# Patient Record
Sex: Male | Born: 1990
Health system: Southern US, Community
[De-identification: ages and names within clinical notes are randomized; demographics above are authoritative.]

## PROBLEM LIST (undated history)

## (undated) DIAGNOSIS — F32A Depression, unspecified: Secondary | ICD-10-CM

## (undated) DIAGNOSIS — R112 Nausea with vomiting, unspecified: Secondary | ICD-10-CM

## (undated) DIAGNOSIS — N289 Disorder of kidney and ureter, unspecified: Secondary | ICD-10-CM

## (undated) DIAGNOSIS — N201 Calculus of ureter: Secondary | ICD-10-CM

## (undated) DIAGNOSIS — Z9889 Other specified postprocedural states: Secondary | ICD-10-CM

## (undated) DIAGNOSIS — F419 Anxiety disorder, unspecified: Secondary | ICD-10-CM

## (undated) DIAGNOSIS — R51 Headache: Secondary | ICD-10-CM

## (undated) DIAGNOSIS — Z87442 Personal history of urinary calculi: Secondary | ICD-10-CM

## (undated) DIAGNOSIS — F329 Major depressive disorder, single episode, unspecified: Secondary | ICD-10-CM

## (undated) DIAGNOSIS — A4902 Methicillin resistant Staphylococcus aureus infection, unspecified site: Secondary | ICD-10-CM

## (undated) DIAGNOSIS — F909 Attention-deficit hyperactivity disorder, unspecified type: Secondary | ICD-10-CM

## (undated) DIAGNOSIS — R519 Headache, unspecified: Secondary | ICD-10-CM

## (undated) DIAGNOSIS — J45909 Unspecified asthma, uncomplicated: Secondary | ICD-10-CM

## (undated) HISTORY — DX: Anxiety disorder, unspecified: F41.9

## (undated) HISTORY — PX: MOUTH SURGERY: SHX715

## (undated) HISTORY — DX: Major depressive disorder, single episode, unspecified: F32.9

## (undated) HISTORY — DX: Unspecified asthma, uncomplicated: J45.909

## (undated) HISTORY — DX: Depression, unspecified: F32.A

---
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
## 2012-09-23 ENCOUNTER — Ambulatory Visit: Payer: No Typology Code available for payment source

## 2012-09-23 ENCOUNTER — Encounter: Payer: Self-pay | Admitting: Family Medicine

## 2012-09-23 ENCOUNTER — Ambulatory Visit (INDEPENDENT_AMBULATORY_CARE_PROVIDER_SITE_OTHER): Payer: No Typology Code available for payment source | Admitting: Family Medicine

## 2012-09-23 VITALS — BP 140/70 | HR 84 | Temp 98.0°F | Resp 16 | Ht 70.5 in | Wt 212.8 lb

## 2012-09-23 DIAGNOSIS — S61219A Laceration without foreign body of unspecified finger without damage to nail, initial encounter: Secondary | ICD-10-CM

## 2012-09-23 DIAGNOSIS — M25549 Pain in joints of unspecified hand: Secondary | ICD-10-CM

## 2012-09-23 DIAGNOSIS — S61209A Unspecified open wound of unspecified finger without damage to nail, initial encounter: Secondary | ICD-10-CM

## 2012-09-23 MED ORDER — HYDROCODONE-ACETAMINOPHEN 5-325 MG PO TABS: 1.0000 | ORAL_TABLET | Freq: Four times a day (QID) | ORAL | Status: DC | PRN

## 2012-09-23 NOTE — Progress Notes (Signed)
Xray read and patient discussed with Dr. Voss. Agree with assessment and plan of care per her note.   

## 2012-09-23 NOTE — Patient Instructions (Addendum)
You have a laceration of your finger that goes into the joint and you disrupted the ligament on the outside of the joint capsule. Your xrays do not show any fracture.  Call Dr. Mina Marble with hand surgery tomorrow. He is expecting your phone call and will see you in the office tomorrow.   [facility] [address] [phone]

## 2012-09-23 NOTE — Progress Notes (Signed)
  Subjective:    Patient ID: Chris Proctor [facility], male    DOB: 1991/01/27, 22 y.o.   MRN: [id number]  HPI Patient presents with left index finger laceration that happened tonight. He was opening a package with a pocket knife and accidentally jabbed himself with the pocket knife. He had immediate pain and bleeding. Denies numbness of the finger. Can flex and extend the finger. Has normal sensation distally   Review of Systems  All other systems reviewed and are negative.       Objective:   Physical Exam  Constitutional: He is oriented to person, place, and time. He appears well-developed and well-nourished. He appears distressed.  HENT:  Head: Normocephalic and atraumatic.  Eyes: Conjunctivae and EOM are normal.  Cardiovascular: Normal rate.   Pulmonary/Chest: Effort normal.  Neurological: He is alert and oriented to person, place, and time.  Skin: He is diaphoretic.  Psychiatric: He has a normal mood and affect.  Left Hand: There is a 1 cm laceration on the radial aspect of the index finger between the DIP and PIP joint. Patient is able to actively flex and extend digit. There is apparent disruption of the radial collateral ligament with ulnar subluxation of the distal phalynx with digital flexion. On exploration, there is visible complete disruption of the radial collateral ligament with disruption of the joint capsule and visible joint space. Minimal bleeding. Normal sensation and capillary refill  [facility] reading (PRIMARY) by  Dr. Neomia Dear. 3 view xrays of the left index finger obtained and personally reviewed show no bony deformities or fracture.    Assessment & Plan:  #1. Left index finger laceration with open disruption PIP joint and complete separation of radial collateral ligament. - Digital block with aggressive irrigation and exploration shows open disruption of joint capsule and disruption of radial collateral ligament of the PIP joint. - Xrays of finger show - Discussed with ortho  hand, Dr. Mina Marble. Recommended loose closure and f/u tomorrow. - Last tetanus 2 yrs ago. Not due.  Procedure Note: Patient verbally consented. Digital block to left index finger performed using 4 cc of 1% lidocaine. Anesthesia verified. Skin and wound cleaned with soapy water. Wound then explored with forceps and hemostat and disruption of joint capsule and radial collateral ligament identified. Wound aggressively irrigated and xrays obtained. Case discussed with Dr. Markus Jarvis recommended loose closure and followup tomorrow. Skin loosely closed with 5-0 prolene with 3 sutures. Patient tolerated procedure well without complication.

## 2013-09-20 ENCOUNTER — Ambulatory Visit (INDEPENDENT_AMBULATORY_CARE_PROVIDER_SITE_OTHER): Payer: No Typology Code available for payment source | Admitting: Emergency Medicine

## 2013-09-20 VITALS — BP 130/72 | HR 97 | Temp 97.9°F | Resp 16 | Ht 71.0 in | Wt 207.0 lb

## 2013-09-20 DIAGNOSIS — L0390 Cellulitis, unspecified: Principal | ICD-10-CM

## 2013-09-20 DIAGNOSIS — L0291 Cutaneous abscess, unspecified: Secondary | ICD-10-CM

## 2013-09-20 MED ORDER — SULFAMETHOXAZOLE-TMP DS 800-160 MG PO TABS: 1.0000 | ORAL_TABLET | Freq: Two times a day (BID) | ORAL | Status: DC

## 2013-09-20 NOTE — Progress Notes (Signed)
[facility] [address], [place] [address] [phone]  Date:  09/20/2013   Name:  Chris Proctor   DOB:  07/03/1990   MRN:  [id number]  PCP:  Londell MohPHARR,WALTER DAVIDSON, MD    Chief Complaint: Neck Pain   History of Present Illness:  Chris Proctor is a 23 y.o. very pleasant male patient who presents with the following:  "pimple" on the back of the neck since yesterday.  No fever or chills. Had some drainage.  No fever or chills.  History of MRSA.  No improvement with over the counter medications or other home remedies. Denies other complaint or health concern today.   There are no active problems to display for this patient.   Past Medical History  Diagnosis Date  . Anxiety   . Asthma   . Depression     History reviewed. No pertinent past surgical history.  History  Substance Use Topics  . Smoking status: Never Smoker   . Smokeless tobacco: Not on file  . Alcohol Use: 1.2 oz/week    2 Cans of beer per week    Family History  Problem Relation Age of Onset  . Hyperlipidemia Mother     No Known Allergies  Medication list has been reviewed and updated.  Current Outpatient Prescriptions on File Prior to Visit  Medication Sig Dispense Refill  . methylphenidate (CONCERTA) 54 MG CR tablet Take 54 mg by mouth daily.      . Sertraline HCl (ZOLOFT PO) Take by mouth.       No current facility-administered medications on file prior to visit.    Review of Systems:  As per HPI, otherwise negative.  Marland Kitchen.  Physical Examination: Filed Vitals:   09/20/13 0923  BP: 130/72  Pulse: 97  Temp: 97.9 F (36.6 C)  Resp: 16   Filed Vitals:   09/20/13 0923  Height: 5\' 11"  (1.803 m)  Weight: 207 lb (93.895 kg)   Body mass index is 28.88 kg/(m^2). Ideal Body Weight: Weight in (lb) to have BMI = 25: 178.9   GEN: WDWN, NAD, Non-toxic, Alert & Oriented x 3 HEENT: Atraumatic, Normocephalic.  Ears and Nose: No external deformity. EXTR: No  clubbing/cyanosis/edema NEURO: Normal gait.  PSYCH: Normally interactive. Conversant. Not depressed or anxious appearing.  Calm demeanor.  NECK:  Marble sized mass on back of neck.  Tender.  Not fluctuant.  No cellulitis.   Assessment and Plan: Abscess neck Septra  Local heat Follow up PRN   Signed,  Phillips OdorJeffery Ryver Zadrozny, MD

## 2013-09-20 NOTE — Patient Instructions (Signed)

## 2014-07-12 IMAGING — CR DG FINGER INDEX 2+V*L*
1 series · 1 of 1 positions shown · non-contrast
Comparison: None

CLINICAL DATA: No laceration.

LEFT INDEX FINGER 2+V

[PA]
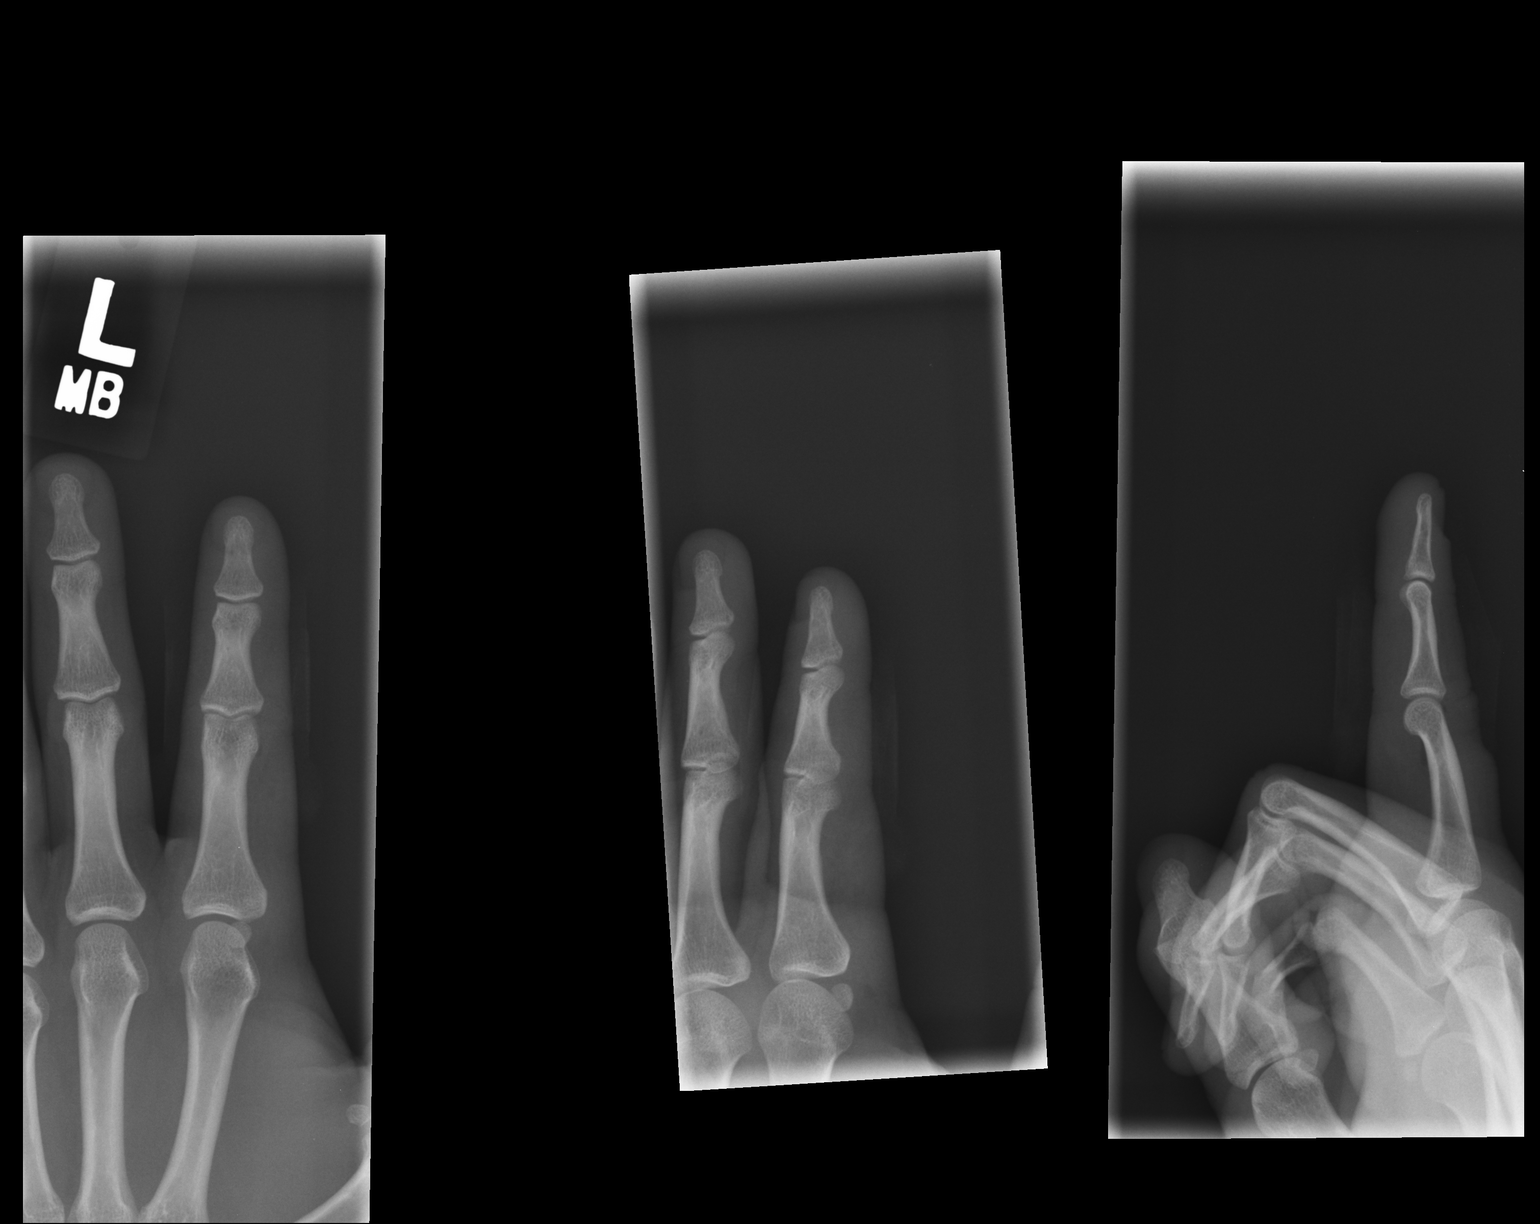

[1 of 1 positions shown; findings below may reference images not displayed]

FINDINGS: No acute bony abnormality.  Specifically, no fracture,
subluxation, or dislocation.  Soft tissues are intact. Joint spaces
are maintained.  Normal bone mineralization.  No radiopaque foreign
body.
IMPRESSION: No acute bony abnormality.

Clinically significant discrepancy from primary report, if
provided: None

## 2014-11-30 NOTE — Progress Notes (Signed)
Please put orders in Epic surgery 12-05-14 pre op 12-01-14 Thanks 

## 2014-11-30 NOTE — Patient Instructions (Addendum)
[facility]  11/30/2014   Your procedure is scheduled on: 12/05/2014   Report to [facility] Main  Entrance take [facility]  elevators to 3rd floor to  Short Stay Center at   0530 AM.  Call this number if you have problems the morning of surgery [phone]   Remember: ONLY 1 PERSON MAY GO WITH YOU TO SHORT STAY TO GET  READY MORNING OF YOUR SURGERY.  Do not eat food or drink liquids :After Midnight.     Take these medicines the morning of surgery with A SIP OF WATER:  Albuterol Inhaler if needed and bring,  Zoloft                                 You may not have any metal on your body including hair pins and              piercings  Do not wear jewelry, , lotions, powders or perfumes, deodorant                         Men may shave face and neck.   Do not bring valuables to the hospital. [facility] IS NOT             RESPONSIBLE   FOR VALUABLES.  Contacts, dentures or bridgework may not be worn into surgery.       Patients discharged the day of surgery will not be allowed to drive home.  Name and phone number of your driver:  Special Instructions: coughing and deep breathing exercises, leg exercises               Please read over the following fact sheets you were given: _____________________________________________________________________             [facility] - Preparing for Surgery Before surgery, you can play an important role.  Because skin is not sterile, your skin needs to be as free of germs as possible.  You can reduce the number of germs on your skin by washing with CHG (chlorahexidine gluconate) soap before surgery.  CHG is an antiseptic cleaner which kills germs and bonds with the skin to continue killing germs even after washing. Please DO NOT use if you have an allergy to CHG or antibacterial soaps.  If your skin becomes reddened/irritated stop using the CHG and inform your nurse when you arrive at Short Stay. Do not shave (including legs  and underarms) for at least 48 hours prior to the first CHG shower.  You may shave your face/neck. Please follow these instructions carefully:  1.  Shower with CHG Soap the night before surgery and the  morning of Surgery.  2.  If you choose to wash your hair, wash your hair first as usual with your  normal  shampoo.  3.  After you shampoo, rinse your hair and body thoroughly to remove the  shampoo.                           4.  Use CHG as you would any other liquid soap.  You can apply chg directly  to the skin and wash  Gently with a scrungie or clean washcloth.  5.  Apply the CHG Soap to your body ONLY FROM THE NECK DOWN.   Do not use on face/ open                           Wound or open sores. Avoid contact with eyes, ears mouth and genitals (private parts).                       Wash face,  Genitals (private parts) with your normal soap.             6.  Wash thoroughly, paying special attention to the area where your surgery  will be performed.  7.  Thoroughly rinse your body with warm water from the neck down.  8.  DO NOT shower/wash with your normal soap after using and rinsing off  the CHG Soap.                9.  Pat yourself dry with a clean towel.            10.  Wear clean pajamas.            11.  Place clean sheets on your bed the night of your first shower and do not  sleep with pets. Day of Surgery : Do not apply any lotions/deodorants the morning of surgery.  Please wear clean clothes to the hospital/surgery center.  FAILURE TO FOLLOW THESE INSTRUCTIONS MAY RESULT IN THE CANCELLATION OF YOUR SURGERY PATIENT SIGNATURE_________________________________  NURSE SIGNATURE__________________________________  ________________________________________________________________________

## 2014-12-01 ENCOUNTER — Ambulatory Visit: Payer: Self-pay | Admitting: Surgery

## 2014-12-01 ENCOUNTER — Encounter (HOSPITAL_COMMUNITY): Payer: Self-pay

## 2014-12-01 ENCOUNTER — Encounter (HOSPITAL_COMMUNITY)
Admission: RE | Admit: 2014-12-01 | Discharge: 2014-12-01 | Disposition: A | Discharge status: Home / Self Care | Payer: No Typology Code available for payment source | Source: Ambulatory Visit | Attending: Surgery | Admitting: Surgery

## 2014-12-01 DIAGNOSIS — K4090 Unilateral inguinal hernia, without obstruction or gangrene, not specified as recurrent: Secondary | ICD-10-CM | POA: Insufficient documentation

## 2014-12-01 DIAGNOSIS — Z01818 Encounter for other preprocedural examination: Secondary | ICD-10-CM | POA: Insufficient documentation

## 2014-12-01 HISTORY — DX: Headache, unspecified: R51.9

## 2014-12-01 HISTORY — DX: Other specified postprocedural states: Z98.890

## 2014-12-01 HISTORY — DX: Attention-deficit hyperactivity disorder, unspecified type: F90.9

## 2014-12-01 HISTORY — DX: Headache: R51

## 2014-12-01 HISTORY — DX: Nausea with vomiting, unspecified: R11.2

## 2014-12-01 HISTORY — DX: Methicillin resistant Staphylococcus aureus infection, unspecified site: A49.02

## 2014-12-01 LAB — CBC
HCT: 46.3 % (ref 39.0–52.0)
Hemoglobin: 16 g/dL (ref 13.0–17.0)
MCH: 28.2 pg (ref 26.0–34.0)
MCHC: 34.6 g/dL (ref 30.0–36.0)
MCV: 81.5 fL (ref 78.0–100.0)
Platelets: 239 10*3/uL (ref 150–400)
RBC: 5.68 MIL/uL (ref 4.22–5.81)
RDW: 13.9 % (ref 11.5–15.5)
WBC: 7.5 10*3/uL (ref 4.0–10.5)

## 2014-12-01 LAB — SURGICAL PCR SCREEN
MRSA, PCR: NEGATIVE
Staphylococcus aureus: NEGATIVE

## 2014-12-01 NOTE — Progress Notes (Signed)
91.5 on temp for preop appointment was posted in error.  Correct temp for preop appt was 97.5

## 2014-12-04 ENCOUNTER — Encounter (HOSPITAL_COMMUNITY): Payer: Self-pay | Admitting: Anesthesiology

## 2014-12-04 DIAGNOSIS — K4090 Unilateral inguinal hernia, without obstruction or gangrene, not specified as recurrent: Secondary | ICD-10-CM

## 2014-12-04 NOTE — Anesthesia Preprocedure Evaluation (Addendum)
Anesthesia Evaluation  Patient identified by MRN, date of birth, ID band Patient awake    Reviewed: Allergy & Precautions, NPO status , Patient's Chart, lab work & pertinent test results  History of Anesthesia Complications (+) PONV and history of anesthetic complications  Airway Mallampati: II  TM Distance: >3 FB Neck ROM: Full    Dental no notable dental hx.    Pulmonary asthma ,    Pulmonary exam normal breath sounds clear to auscultation       Cardiovascular negative cardio ROS Normal cardiovascular exam Rhythm:Regular Rate:Normal     Neuro/Psych  Headaches, PSYCHIATRIC DISORDERS Anxiety Depression    GI/Hepatic negative GI ROS, Neg liver ROS,   Endo/Other  negative endocrine ROS  Renal/GU negative Renal ROS  negative genitourinary   Musculoskeletal negative musculoskeletal ROS (+)   Abdominal (+) + obese,   Peds negative pediatric ROS (+)  Hematology negative hematology ROS (+)   Anesthesia Other Findings   Reproductive/Obstetrics negative OB ROS                             Anesthesia Physical Anesthesia Plan  ASA: II  Anesthesia Plan: General   Post-op Pain Management:    Induction: Intravenous  Airway Management Planned: Oral ETT  Additional Equipment:   Intra-op Plan:   Post-operative Plan: Extubation in OR  Informed Consent: I have reviewed the patients History and Physical, chart, labs and discussed the procedure including the risks, benefits and alternatives for the proposed anesthesia with the patient or authorized representative who has indicated his/her understanding and acceptance.   Dental advisory given  Plan Discussed with: CRNA  Anesthesia Plan Comments:        Anesthesia Quick Evaluation

## 2014-12-04 NOTE — H&P (Signed)
General Surgery [facility]  [place] DOB: 03/28/1990 Single / Language: [place] / Race: White Male  History of Present Illness Patient words: hernia.  The patient is a 24 year old male who presents with an inguinal hernia. Patient is referred by the urgent care in [facility] for evaluation of newly diagnosed left inguinal hernia. Patient had experienced intermittent discomfort and had noticed a swelling in the left groin region over the past few months. He presented to urgent care for evaluation. He was noted to have a left inguinal hernia and was referred to general surgery for repair. Since the patient's family lives here, he returned home for evaluation. Patient has noted intermittent discomfort. He denies any signs or symptoms of obstruction. He has had no prior abdominal surgery and no prior hernia repairs.   Other Problems Anxiety Disorder Asthma Depression Migraine Headache Other disease, cancer, significant illness  Past Surgical History Oral Surgery  Diagnostic Studies History Colonoscopy never  Allergies No Known Drug Allergies10/06/2014  Medication History ProAir HFA (108 (90 Base)MCG/ACT Aerosol Soln, Inhalation) Active. Methylphenidate HCl ER (  Tablet ER, Oral) Active. Sertraline HCl (  Tablet, Oral) Active. Medications Reconciled  Social History Alcohol use Occasional alcohol use. Caffeine use Carbonated beverages, Coffee, Tea. No drug use Tobacco use Never smoker.  Family History Arthritis Mother. Heart Disease Father, Mother. Hypertension Father, Mother.  Review of Systems General Not Present- Appetite Loss, Chills, Fatigue, Fever, Night Sweats, Weight Gain and Weight Loss. Skin Not Present- Change in Wart/Mole, Dryness, Hives, Jaundice, New Lesions, Non-Healing Wounds, Rash and Ulcer. HEENT Not Present- Earache, Hearing Loss, Hoarseness, Nose Bleed, Oral Ulcers, Ringing in the  Ears, Seasonal Allergies, Sinus Pain, Sore Throat, Visual Disturbances, Wears glasses/contact lenses and Yellow Eyes. Respiratory Not Present- Bloody sputum, Chronic Cough, Difficulty Breathing, Snoring and Wheezing. Breast Not Present- Breast Mass, Breast Pain, Nipple Discharge and Skin Changes. Cardiovascular Not Present- Chest Pain, Difficulty Breathing Lying Down, Leg Cramps, Palpitations, Rapid Heart Rate, Shortness of Breath and Swelling of Extremities. Gastrointestinal Not Present- Abdominal Pain, Bloating, Bloody Stool, Change in Bowel Habits, Chronic diarrhea, Constipation, Difficulty Swallowing, Excessive gas, Gets full quickly at meals, Hemorrhoids, Indigestion, Nausea, Rectal Pain and Vomiting. Male Genitourinary Not Present- Blood in Urine, Change in Urinary Stream, Frequency, Impotence, Nocturia, Painful Urination, Urgency and Urine Leakage. Musculoskeletal Not Present- Back Pain, Joint Pain, Joint Stiffness, Muscle Pain, Muscle Weakness and Swelling of Extremities. Neurological Not Present- Decreased Memory, Fainting, Headaches, Numbness, Seizures, Tingling, Tremor, Trouble walking and Weakness. Psychiatric Present- Anxiety and Depression. Not Present- Bipolar, Change in Sleep Pattern, Fearful and Frequent crying. Endocrine Not Present- Cold Intolerance, Excessive Hunger, Hair Changes, Heat Intolerance, Hot flashes and New Diabetes. Hematology Not Present- Easy Bruising, Excessive bleeding, Gland problems, HIV and Persistent Infections.   Vitals Weight: 246.4 lb Height: 72in Body Surface Area: 2.38 m Body Mass Index: 33.42 kg/m Temp.: 98.38F(Oral)  Pulse: 91 (Regular)  BP: 118/74 (Sitting, Left Arm, Standard)    Physical Exam  General - appears comfortable, no distress; not diaphorectic  HEENT - normocephalic; sclerae clear, gaze conjugate; mucous membranes moist, dentition good; voice normal  Neck - symmetric on extension; no palpable anterior or posterior  cervical adenopathy; no palpable masses in the thyroid bed  Chest - clear bilaterally without rhonchi, rales, or wheeze  Cor - regular rhythm with normal rate; no significant murmur  Abd - soft without distension; no sign of umbilical hernia  GU - normal male genitalia without mass or lesion; palpation in  the right inguinal canal with cough and Valsalva shows no sign of hernia; palpation in the left inguinal canal shows a moderate to large inguinal hernia sac which is mildly tender but soft and relatively reducible; with cough and Valsalva it augments  Ext - non-tender without significant edema or lymphedema  Neuro - grossly intact; no tremor   Assessment & Plan  REDUCIBLE LEFT INGUINAL HERNIA (K40.90)  Patient presents with symptomatic left inguinal hernia. Patient and his mother are provided with written literature on hernia repair to review at home.  I recommended repair of left inguinal hernia by open technique with mesh. We discussed the risk and benefits including the potential for recurrence. We have discussed restrictions on his activities following the procedure.  Patient is on fall break and would like to proceed with hernia repair as soon as possible. We will make arrangements for surgery as an outpatient at a time convenient for the patient.  The risks and benefits of the procedure have been discussed at length with the patient. The patient understands the proposed procedure, potential alternative treatments, and the course of recovery to be expected. All of the patient's questions have been answered at this time. The patient wishes to proceed with surgery.  Velora Heckler, MD, [facility] Office: [phone]

## 2014-12-05 ENCOUNTER — Ambulatory Visit (HOSPITAL_COMMUNITY): Payer: No Typology Code available for payment source | Admitting: Anesthesiology

## 2014-12-05 ENCOUNTER — Encounter (HOSPITAL_COMMUNITY): Payer: Self-pay

## 2014-12-05 ENCOUNTER — Encounter (HOSPITAL_COMMUNITY)
Admission: RE | Disposition: A | Discharge status: Home / Self Care | Payer: Self-pay | Source: Ambulatory Visit | Attending: Surgery

## 2014-12-05 ENCOUNTER — Ambulatory Visit (HOSPITAL_COMMUNITY)
Admission: RE | Admit: 2014-12-05 | Discharge: 2014-12-05 | Disposition: A | Discharge status: Home / Self Care | Payer: No Typology Code available for payment source | Source: Ambulatory Visit | Attending: Surgery | Admitting: Surgery

## 2014-12-05 DIAGNOSIS — K4090 Unilateral inguinal hernia, without obstruction or gangrene, not specified as recurrent: Secondary | ICD-10-CM | POA: Insufficient documentation

## 2014-12-05 DIAGNOSIS — J45909 Unspecified asthma, uncomplicated: Secondary | ICD-10-CM | POA: Insufficient documentation

## 2014-12-05 DIAGNOSIS — Z6833 Body mass index (BMI) 33.0-33.9, adult: Secondary | ICD-10-CM | POA: Diagnosis not present

## 2014-12-05 DIAGNOSIS — F329 Major depressive disorder, single episode, unspecified: Secondary | ICD-10-CM | POA: Diagnosis not present

## 2014-12-05 DIAGNOSIS — E669 Obesity, unspecified: Secondary | ICD-10-CM | POA: Diagnosis not present

## 2014-12-05 DIAGNOSIS — F419 Anxiety disorder, unspecified: Secondary | ICD-10-CM | POA: Diagnosis not present

## 2014-12-05 HISTORY — PX: INSERTION OF MESH: SHX5868

## 2014-12-05 HISTORY — PX: INGUINAL HERNIA REPAIR: SHX194

## 2014-12-05 SURGERY — REPAIR, HERNIA, INGUINAL, ADULT
Anesthesia: General | Laterality: Left

## 2014-12-05 MED ORDER — CEFAZOLIN SODIUM-DEXTROSE 2-3 GM-% IV SOLR
2.0000 g | INTRAVENOUS | Status: AC
  Administered 2014-12-05: 2 g via INTRAVENOUS

## 2014-12-05 MED ORDER — ROCURONIUM BROMIDE 100 MG/10ML IV SOLN
INTRAVENOUS | Status: AC
  Filled 2014-12-05: qty 1

## 2014-12-05 MED ORDER — FENTANYL CITRATE (PF) 250 MCG/5ML IJ SOLN
INTRAMUSCULAR | Status: AC
  Filled 2014-12-05: qty 25

## 2014-12-05 MED ORDER — CEFAZOLIN SODIUM-DEXTROSE 2-3 GM-% IV SOLR
INTRAVENOUS | Status: AC
Start: 2014-12-05 — End: 2014-12-05
  Filled 2014-12-05: qty 50

## 2014-12-05 MED ORDER — ONDANSETRON HCL 4 MG/2ML IJ SOLN
INTRAMUSCULAR | Status: DC | PRN
  Administered 2014-12-05: 4 mg via INTRAVENOUS

## 2014-12-05 MED ORDER — PROPOFOL 10 MG/ML IV BOLUS
INTRAVENOUS | Status: DC | PRN
  Administered 2014-12-05: 200 mg via INTRAVENOUS

## 2014-12-05 MED ORDER — SCOPOLAMINE 1 MG/3DAYS TD PT72
MEDICATED_PATCH | TRANSDERMAL | Status: DC | PRN
  Administered 2014-12-05: 1 via TRANSDERMAL

## 2014-12-05 MED ORDER — LIDOCAINE HCL (CARDIAC) 20 MG/ML IV SOLN
INTRAVENOUS | Status: DC | PRN
  Administered 2014-12-05: 100 mg via INTRAVENOUS

## 2014-12-05 MED ORDER — SCOPOLAMINE 1 MG/3DAYS TD PT72
MEDICATED_PATCH | TRANSDERMAL | Status: AC
  Filled 2014-12-05: qty 1

## 2014-12-05 MED ORDER — HYDROMORPHONE HCL 2 MG/ML IJ SOLN
INTRAMUSCULAR | Status: AC
  Filled 2014-12-05: qty 1

## 2014-12-05 MED ORDER — ONDANSETRON HCL 4 MG/2ML IJ SOLN
INTRAMUSCULAR | Status: AC
  Filled 2014-12-05: qty 2

## 2014-12-05 MED ORDER — FENTANYL CITRATE (PF) 100 MCG/2ML IJ SOLN
INTRAMUSCULAR | Status: DC | PRN
  Administered 2014-12-05: 50 ug via INTRAVENOUS
  Administered 2014-12-05: 100 ug via INTRAVENOUS
  Administered 2014-12-05 (×2): 50 ug via INTRAVENOUS

## 2014-12-05 MED ORDER — GLYCOPYRROLATE 0.2 MG/ML IJ SOLN
INTRAMUSCULAR | Status: AC
  Filled 2014-12-05: qty 3

## 2014-12-05 MED ORDER — BUPIVACAINE HCL (PF) 0.5 % IJ SOLN
INTRAMUSCULAR | Status: AC
  Filled 2014-12-05: qty 30

## 2014-12-05 MED ORDER — BUPIVACAINE HCL (PF) 0.5 % IJ SOLN
INTRAMUSCULAR | Status: DC | PRN
Start: 2014-12-05 — End: 2014-12-05
  Administered 2014-12-05: 30 mL

## 2014-12-05 MED ORDER — FENTANYL CITRATE (PF) 100 MCG/2ML IJ SOLN: 25.0000 ug | INTRAMUSCULAR | Status: DC | PRN

## 2014-12-05 MED ORDER — HYDROMORPHONE HCL 1 MG/ML IJ SOLN
INTRAMUSCULAR | Status: DC | PRN
  Administered 2014-12-05: 0.5 mg via INTRAVENOUS
  Administered 2014-12-05: 1 mg via INTRAVENOUS
  Administered 2014-12-05: 0.5 mg via INTRAVENOUS

## 2014-12-05 MED ORDER — LACTATED RINGERS IV SOLN: INTRAVENOUS | Status: DC

## 2014-12-05 MED ORDER — MIDAZOLAM HCL 5 MG/5ML IJ SOLN
INTRAMUSCULAR | Status: DC | PRN
  Administered 2014-12-05: 2 mg via INTRAVENOUS

## 2014-12-05 MED ORDER — NEOSTIGMINE METHYLSULFATE 10 MG/10ML IV SOLN
INTRAVENOUS | Status: DC | PRN
  Administered 2014-12-05: 4 mg via INTRAVENOUS

## 2014-12-05 MED ORDER — PROMETHAZINE HCL 25 MG/ML IJ SOLN: 6.2500 mg | INTRAMUSCULAR | Status: DC | PRN

## 2014-12-05 MED ORDER — DEXAMETHASONE SODIUM PHOSPHATE 10 MG/ML IJ SOLN
INTRAMUSCULAR | Status: AC
  Filled 2014-12-05: qty 1

## 2014-12-05 MED ORDER — BUPIVACAINE-EPINEPHRINE (PF) 0.25% -1:200000 IJ SOLN
INTRAMUSCULAR | Status: AC
  Filled 2014-12-05: qty 30

## 2014-12-05 MED ORDER — NEOSTIGMINE METHYLSULFATE 10 MG/10ML IV SOLN
INTRAVENOUS | Status: AC
  Filled 2014-12-05: qty 1

## 2014-12-05 MED ORDER — DEXAMETHASONE SODIUM PHOSPHATE 10 MG/ML IJ SOLN
INTRAMUSCULAR | Status: DC | PRN
  Administered 2014-12-05: 10 mg via INTRAVENOUS

## 2014-12-05 MED ORDER — LACTATED RINGERS IV SOLN
INTRAVENOUS | Status: DC | PRN
  Administered 2014-12-05 (×2): via INTRAVENOUS

## 2014-12-05 MED ORDER — MIDAZOLAM HCL 2 MG/2ML IJ SOLN
INTRAMUSCULAR | Status: AC
  Filled 2014-12-05: qty 4

## 2014-12-05 MED ORDER — PROPOFOL 10 MG/ML IV BOLUS
INTRAVENOUS | Status: AC
  Filled 2014-12-05: qty 20

## 2014-12-05 MED ORDER — LIDOCAINE HCL (CARDIAC) 20 MG/ML IV SOLN
INTRAVENOUS | Status: AC
  Filled 2014-12-05: qty 5

## 2014-12-05 MED ORDER — GLYCOPYRROLATE 0.2 MG/ML IJ SOLN
INTRAMUSCULAR | Status: DC | PRN
  Administered 2014-12-05: 0.6 mg via INTRAVENOUS

## 2014-12-05 MED ORDER — ROCURONIUM BROMIDE 100 MG/10ML IV SOLN
INTRAVENOUS | Status: DC | PRN
  Administered 2014-12-05: 5 mg via INTRAVENOUS
  Administered 2014-12-05: 25 mg via INTRAVENOUS
  Administered 2014-12-05 (×2): 10 mg via INTRAVENOUS

## 2014-12-05 MED ORDER — HYDROCODONE-ACETAMINOPHEN 5-325 MG PO TABS: 1.0000 | ORAL_TABLET | ORAL | Status: DC | PRN

## 2014-12-05 MED ORDER — SUCCINYLCHOLINE CHLORIDE 20 MG/ML IJ SOLN
INTRAMUSCULAR | Status: DC | PRN
  Administered 2014-12-05: 100 mg via INTRAVENOUS

## 2014-12-05 SURGICAL SUPPLY — 37 items
APL SKNCLS STERI-STRIP NONHPOA (GAUZE/BANDAGES/DRESSINGS) ×1
APPLICATOR COTTON TIP 6IN STRL (MISCELLANEOUS) ×2 IMPLANT
BENZOIN TINCTURE PRP APPL 2/3 (GAUZE/BANDAGES/DRESSINGS) ×2 IMPLANT
BLADE HEX COATED 2.75 (ELECTRODE) ×2 IMPLANT
BLADE SURG 15 STRL LF DISP TIS (BLADE) ×1 IMPLANT
BLADE SURG 15 STRL SS (BLADE) ×2
BLADE SURG SZ10 CARB STEEL (BLADE) IMPLANT
COVER SURGICAL LIGHT HANDLE (MISCELLANEOUS) ×2 IMPLANT
DECANTER SPIKE VIAL GLASS SM (MISCELLANEOUS) ×1 IMPLANT
DRAIN PENROSE 18X1/2 LTX STRL (DRAIN) ×2 IMPLANT
DRAPE LAPAROTOMY TRNSV 102X78 (DRAPE) ×2 IMPLANT
ELECT PENCIL ROCKER SW 15FT (MISCELLANEOUS) ×2 IMPLANT
ELECT REM PT RETURN 9FT ADLT (ELECTROSURGICAL) ×2
ELECTRODE REM PT RTRN 9FT ADLT (ELECTROSURGICAL) ×1 IMPLANT
GAUZE SPONGE 4X4 12PLY STRL (GAUZE/BANDAGES/DRESSINGS) ×2 IMPLANT
GLOVE BIOGEL PI IND STRL 7.0 (GLOVE) ×1 IMPLANT
GLOVE BIOGEL PI INDICATOR 7.0 (GLOVE) ×1
GLOVE SURG ORTHO 8.0 STRL STRW (GLOVE) ×2 IMPLANT
GOWN STRL REUS W/TWL LRG LVL3 (GOWN DISPOSABLE) ×2 IMPLANT
GOWN STRL REUS W/TWL XL LVL3 (GOWN DISPOSABLE) ×4 IMPLANT
KIT BASIN OR (CUSTOM PROCEDURE TRAY) ×2 IMPLANT
MESH ULTRAPRO 3X6 7.6X15CM (Mesh General) ×1 IMPLANT
NDL HYPO 25X1 1.5 SAFETY (NEEDLE) ×1 IMPLANT
NEEDLE HYPO 25X1 1.5 SAFETY (NEEDLE) ×2 IMPLANT
NS IRRIG 1000ML POUR BTL (IV SOLUTION) ×3 IMPLANT
PACK BASIC VI WITH GOWN DISP (CUSTOM PROCEDURE TRAY) ×2 IMPLANT
SPONGE LAP 4X18 X RAY DECT (DISPOSABLE) ×4 IMPLANT
STRIP CLOSURE SKIN 1/2X4 (GAUZE/BANDAGES/DRESSINGS) ×2 IMPLANT
SUT MNCRL AB 4-0 PS2 18 (SUTURE) ×2 IMPLANT
SUT NOVA NAB GS-22 2 0 T19 (SUTURE) ×5 IMPLANT
SUT SILK 2 0 SH (SUTURE) ×2 IMPLANT
SUT VIC AB 3-0 SH 18 (SUTURE) ×3 IMPLANT
SYR BULB IRRIGATION 50ML (SYRINGE) ×2 IMPLANT
SYR CONTROL 10ML LL (SYRINGE) ×2 IMPLANT
TAPE CLOTH SURG 4X10 WHT LF (GAUZE/BANDAGES/DRESSINGS) ×1 IMPLANT
TOWEL OR 17X26 10 PK STRL BLUE (TOWEL DISPOSABLE) ×2 IMPLANT
YANKAUER SUCT BULB TIP 10FT TU (MISCELLANEOUS) ×2 IMPLANT

## 2014-12-05 NOTE — Discharge Instructions (Signed)

## 2014-12-05 NOTE — Op Note (Signed)
Inguinal Hernia, Open, Procedure Note  Pre-operative Diagnosis:  Left inguinal hernia, reducible  Post-operative Diagnosis: same  Surgeon:  Velora Heckler, MD, FACS  Anesthesia:  General  Preparation:  Chlora-prep  Estimated Blood Loss: Minimal  Complications:  none  Indications: The patient presented with a left, reducible hernia.  The patient is a 24 year old male who presents with an inguinal hernia. Patient is referred by the urgent care in [facility] for evaluation of newly diagnosed left inguinal hernia. Patient had experienced intermittent discomfort and had noticed a swelling in the left groin region over the past few months. He presented to urgent care for evaluation. He was noted to have a left inguinal hernia and was referred to general surgery for repair.   Procedure Details  The patient was evaluated in the holding area. All of the patient's questions were answered and the proposed procedure was confirmed. The site of the procedure was properly marked. The patient was taken to the Operating Room, identified by name, and the procedure verified as inguinal hernia repair.  The patient was placed in the supine position and underwent induction of anesthesia. A "Time Out" was performed per routine. The lower abdomen and groin were prepped and draped in the usual aseptic fashion.  After ascertaining that an adequate level of anesthesia had been obtained, an incision was made in the groin with a #10 blade.  Dissection was carried through the subcutaneous tissues and hemostasis obtained with the electrocautery.  A Gelpi retractor was placed for exposure.  The external oblique fascia was incised in line with it's fibers and extended through the external inguinal ring.  The cord structures were dissected out of the inguinal canal and encircled with a Penrose drain.  The floor of the inguinal canal was dissected out.  There was some laxity but no fascial defect.  The cord was  explored and a large indirect hernia sac was found and dissected out.  A high ligation was performed with a 2-0 silk suture.  The sac was excised and discarded.  The floor of the inguinal canal was reconstructed with Ethicon Ultrapro mesh cut to the appropriate dimensions.  It was secured to the pubic tubercle with a 2-0 Novafil suture and along the inguinal ligament with a running 2-0 Novafil suture.  Mesh was split to accommodate the cord structures.  The superior margin of the mesh was secured to the transversalis and internal oblique musculature with interrupted 2-0 Novafil sutures.  The tails of the mesh were overlapped lateral to the cord structures and secured to the inguinal ligament with interrupted 2-0 Novafil sutures to recreate the internal inguinal ring.  Cord structures were returned to the inguinal canal.  Local anesthetic was infiltrated throughout the field.  External oblique fascia was closed with interrupted 3-0 Vicryl sutures.  Subcutaneous tissues were closed with interrupted 3-0 Vicryl sutures.  Skin was anesthetized with local anesthetic, and the skin edges were re-approximated with a running 4-0 Monocryl suture.  Wound was washed and dried and benzoin and steristrips were applied.  A gauze dressing was applied.  Instrument, sponge, and needle counts were correct prior to closure and at the conclusion of the case.  The patient tolerated the procedure well.  The patient was awakened from anesthesia and brought to the recovery room in stable condition.  Velora Heckler, MD, [facility] Office: [phone]

## 2014-12-05 NOTE — Anesthesia Postprocedure Evaluation (Signed)
  Anesthesia Post-op Note  Patient: Chris Proctor  Procedure(s) Performed: Procedure(s): LEFT INGUINAL HERNIA REPAIR WITH  MESH (Left) INSERTION OF MESH (Left)  Patient Location: PACU  Anesthesia Type:General  Level of Consciousness: awake and alert   Airway and Oxygen Therapy: Patient Spontanous Breathing  Post-op Pain: Controlled  Post-op Assessment: Post-op Vital signs reviewed, Patient's Cardiovascular Status Stable and Respiratory Function Stable  Post-op Vital Signs: Reviewed  Filed Vitals:   12/05/14 1000  BP: 131/65  Pulse:   Temp: 36.7 C  Resp: 15    Complications: No apparent anesthesia complications

## 2014-12-05 NOTE — Transfer of Care (Signed)
Immediate Anesthesia Transfer of Care Note  Patient: Chris Proctor  Procedure(s) Performed: Procedure(s): LEFT INGUINAL HERNIA REPAIR WITH  MESH (Left) INSERTION OF MESH (Left)  Patient Location: PACU  Anesthesia Type:General  Level of Consciousness: awake, alert  and oriented  Airway & Oxygen Therapy: Patient Spontanous Breathing and Patient connected to face mask oxygen  Post-op Assessment: Report given to RN and Post -op Vital signs reviewed and stable  Post vital signs: Reviewed and stable  Last Vitals:  Filed Vitals:   12/05/14 0524  BP: 131/73  Pulse: 77  Temp: 36.5 C  Resp: 18    Complications: No apparent anesthesia complications

## 2014-12-05 NOTE — Anesthesia Procedure Notes (Signed)
Procedure Name: Intubation Date/Time: 12/05/2014 7:27 AM Performed by: Enriqueta Shutter D Pre-anesthesia Checklist: Patient identified, Emergency Drugs available, Suction available and Patient being monitored Patient Re-evaluated:Patient Re-evaluated prior to inductionOxygen Delivery Method: Circle System Utilized Preoxygenation: Pre-oxygenation with 100% oxygen Intubation Type: IV induction Ventilation: Mask ventilation without difficulty Laryngoscope Size: Mac and 4 Grade View: Grade II Tube type: Oral Tube size: 7.5 mm Number of attempts: 1 Airway Equipment and Method: Stylet and Oral airway Placement Confirmation: ETT inserted through vocal cords under direct vision,  positive ETCO2 and breath sounds checked- equal and bilateral Secured at: 22 cm Tube secured with: Tape Dental Injury: Teeth and Oropharynx as per pre-operative assessment

## 2014-12-05 NOTE — Interval H&P Note (Signed)
History and Physical Interval Note:  12/05/2014 7:08 AM  Chris Proctor  has presented today for surgery, with the diagnosis of Left Inguinal Hernia.  The various methods of treatment have been discussed with the patient and family. After consideration of risks, benefits and other options for treatment, the patient has consented to    Procedure(s): LEFT INGUINAL HERNIA REPAIR WITH  MESH (Left) INSERTION OF MESH (Left) as a surgical intervention .    The patient's history has been reviewed, patient examined, no change in status, stable for surgery.  I have reviewed the patient's chart and labs.  Questions were answered to the patient's satisfaction.    Velora Heckler, MD, [facility] Office: [phone]    Destinie Thornsberry Judie Petit

## 2016-02-08 DIAGNOSIS — H6123 Impacted cerumen, bilateral: Secondary | ICD-10-CM | POA: Diagnosis not present

## 2016-06-26 ENCOUNTER — Encounter (HOSPITAL_COMMUNITY): Payer: Self-pay

## 2016-06-26 ENCOUNTER — Emergency Department (HOSPITAL_COMMUNITY)
Admission: EM | Admit: 2016-06-26 | Discharge: 2016-06-26 | Disposition: A | Discharge status: Home / Self Care | Payer: BLUE CROSS/BLUE SHIELD | Attending: Emergency Medicine | Admitting: Emergency Medicine

## 2016-06-26 ENCOUNTER — Emergency Department (HOSPITAL_COMMUNITY): Payer: BLUE CROSS/BLUE SHIELD

## 2016-06-26 DIAGNOSIS — N200 Calculus of kidney: Secondary | ICD-10-CM | POA: Insufficient documentation

## 2016-06-26 DIAGNOSIS — J45909 Unspecified asthma, uncomplicated: Secondary | ICD-10-CM | POA: Diagnosis not present

## 2016-06-26 DIAGNOSIS — N1330 Unspecified hydronephrosis: Secondary | ICD-10-CM | POA: Insufficient documentation

## 2016-06-26 DIAGNOSIS — F909 Attention-deficit hyperactivity disorder, unspecified type: Secondary | ICD-10-CM | POA: Insufficient documentation

## 2016-06-26 DIAGNOSIS — R109 Unspecified abdominal pain: Secondary | ICD-10-CM | POA: Diagnosis present

## 2016-06-26 HISTORY — DX: Disorder of kidney and ureter, unspecified: N28.9

## 2016-06-26 LAB — URINALYSIS, ROUTINE W REFLEX MICROSCOPIC
Bacteria, UA: NONE SEEN
Bilirubin Urine: NEGATIVE
Glucose, UA: NEGATIVE mg/dL
Ketones, ur: NEGATIVE mg/dL
Leukocytes, UA: NEGATIVE
Nitrite: NEGATIVE
Protein, ur: 100 mg/dL — AB
Specific Gravity, Urine: 1.023 (ref 1.005–1.030)
Squamous Epithelial / LPF: NONE SEEN
pH: 5 (ref 5.0–8.0)

## 2016-06-26 LAB — I-STAT CHEM 8, ED
BUN: 12 mg/dL (ref 6–20)
Calcium, Ion: 1.18 mmol/L (ref 1.15–1.40)
Chloride: 105 mmol/L (ref 101–111)
Creatinine, Ser: 0.7 mg/dL (ref 0.61–1.24)
Glucose, Bld: 103 mg/dL — ABNORMAL HIGH (ref 65–99)
HCT: 43 % (ref 39.0–52.0)
Hemoglobin: 14.6 g/dL (ref 13.0–17.0)
Potassium: 4.5 mmol/L (ref 3.5–5.1)
Sodium: 140 mmol/L (ref 135–145)
TCO2: 27 mmol/L (ref 0–100)

## 2016-06-26 MED ORDER — TAMSULOSIN HCL 0.4 MG PO CAPS: 0.4000 mg | ORAL_CAPSULE | Freq: Every day | ORAL | 0 refills | Status: DC

## 2016-06-26 MED ORDER — KETOROLAC TROMETHAMINE 30 MG/ML IJ SOLN
30.0000 mg | Freq: Once | INTRAMUSCULAR | Status: AC
  Administered 2016-06-26: 30 mg via INTRAVENOUS
  Filled 2016-06-26: qty 1

## 2016-06-26 MED ORDER — HYDROMORPHONE HCL 1 MG/ML IJ SOLN
1.0000 mg | Freq: Once | INTRAMUSCULAR | Status: AC
  Administered 2016-06-26: 1 mg via INTRAVENOUS
  Filled 2016-06-26: qty 1

## 2016-06-26 MED ORDER — OXYCODONE-ACETAMINOPHEN 5-325 MG PO TABS: 1.0000 | ORAL_TABLET | Freq: Four times a day (QID) | ORAL | 0 refills | Status: DC | PRN

## 2016-06-26 MED ORDER — ONDANSETRON 4 MG PO TBDP: 4.0000 mg | ORAL_TABLET | Freq: Three times a day (TID) | ORAL | 0 refills | Status: DC | PRN

## 2016-06-26 MED ORDER — ONDANSETRON HCL 4 MG/2ML IJ SOLN
4.0000 mg | Freq: Once | INTRAMUSCULAR | Status: AC
  Administered 2016-06-26: 4 mg via INTRAVENOUS
  Filled 2016-06-26: qty 2

## 2016-06-26 MED ORDER — OXYCODONE-ACETAMINOPHEN 5-325 MG PO TABS
1.0000 | ORAL_TABLET | Freq: Once | ORAL | Status: AC
  Administered 2016-06-26: 1 via ORAL
  Filled 2016-06-26: qty 1

## 2016-06-26 MED ORDER — IBUPROFEN 600 MG PO TABS: 600.0000 mg | ORAL_TABLET | Freq: Four times a day (QID) | ORAL | 0 refills | Status: DC | PRN

## 2016-06-26 NOTE — ED Provider Notes (Signed)
[facility]-EMERGENCY DEPT Provider Note   CSN: [id number] Arrival date & time: 06/26/16  0335     History   Chief Complaint Chief Complaint  Patient presents with  . Flank Pain    HPI Chris Proctor is a 26 y.o. male.  26 year old male with a history of anxiety, depression, and asthma presents to the emergency department for evaluation of left-sided flank pain which woke him from sleep at 3 AM. He states that pain is nonradiating and constant. It is currently rated at 7/10. No medications taken prior to arrival for symptoms. Patient reports that symptoms feel similar to past history of kidney stones. Last stone was 2 years ago. No history of intervention to promote stone movement. Patient reports noticing a darker color to his urine yesterday. No associated vomiting, fevers, dysuria. Abdominal surgical history significant for inguinal hernia repair.   The history is provided by the patient. No language interpreter was used.  Flank Pain     Past Medical History:  Diagnosis Date  . ADHD (attention deficit hyperactivity disorder)   . Anxiety   . Asthma   . Depression   . Headache    hx of migraines   . MRSA infection    hx of in 2006 and 2009   . PONV (postoperative nausea and vomiting)   . Renal disorder     Patient Active Problem List   Diagnosis Date Noted  . Reducible left inguinal hernia 12/04/2014    Past Surgical History:  Procedure Laterality Date  . INGUINAL HERNIA REPAIR Left 12/05/2014   Procedure: LEFT INGUINAL HERNIA REPAIR WITH  MESH;  Surgeon: Darnell Level, MD;  Location: [facility] ORS;  Service: General;  Laterality: Left;  . INSERTION OF MESH Left 12/05/2014   Procedure: INSERTION OF MESH;  Surgeon: Darnell Level, MD;  Location: [facility] ORS;  Service: General;  Laterality: Left;  . MOUTH SURGERY         Home Medications    Prior to Admission medications   Medication Sig Start Date End Date Taking? Authorizing Provider  albuterol (PROVENTIL HFA;VENTOLIN HFA) 108  (90 BASE) MCG/ACT inhaler Inhale 1 puff into the lungs every 6 (six) hours as needed for wheezing or shortness of breath.   Yes Historical Provider, MD  buPROPion (WELLBUTRIN XL) 150 MG 24 hr tablet Take 1 tablet by mouth daily. 06/05/16  Yes Historical Provider, MD  methylphenidate (CONCERTA) 54 MG CR tablet Take 54 mg by mouth every morning.    Yes Historical Provider, MD  sertraline (ZOLOFT) 100 MG tablet TAKE 1 1/2 TABLETS BY MOUTH ONCE A DAY 11/02/14  Yes Historical Provider, MD  HYDROcodone-acetaminophen (NORCO/VICODIN) 5-325 MG tablet Take 1-2 tablets by mouth every 4 (four) hours as needed for moderate pain. Patient not taking: Reported on 06/26/2016 12/05/14   Darnell Level, MD  ibuprofen (ADVIL,MOTRIN) 600 MG tablet Take 1 tablet (600 mg total) by mouth every 6 (six) hours as needed. 06/26/16   Antony Madura, PA-C  ondansetron (ZOFRAN ODT) 4 MG disintegrating tablet Take 1 tablet (4 mg total) by mouth every 8 (eight) hours as needed for nausea or vomiting. 06/26/16   Antony Madura, PA-C  oxyCODONE-acetaminophen (PERCOCET/ROXICET) 5-325 MG tablet Take 1-2 tablets by mouth every 6 (six) hours as needed for severe pain. 06/26/16   Antony Madura, PA-C  tamsulosin (FLOMAX) 0.4 MG CAPS capsule Take 1 capsule (0.4 mg total) by mouth daily. 06/26/16   Antony Madura, PA-C    Family History Family History  Problem Relation Age of Onset  .  Hyperlipidemia Mother     Social History Social History  Substance Use Topics  . Smoking status: Never Smoker  . Smokeless tobacco: Never Used  . Alcohol use 1.2 oz/week    2 Cans of beer per week     Comment: 1-2 x per week or beer on weekend     Allergies   Patient has no known allergies.   Review of Systems Review of Systems  Genitourinary: Positive for flank pain.   Ten systems reviewed and are negative for acute change, except as noted in the HPI.    Physical Exam Updated Vital Signs BP 135/86 (BP Location: Right Arm)   Pulse 78   Temp 98 F (36.7 C)  (Oral)   Resp 18   SpO2 99%   Physical Exam  Constitutional: He is oriented to person, place, and time. He appears well-developed and well-nourished. No distress.  Patient calm, cooperative  HENT:  Head: Normocephalic and atraumatic.  Eyes: Conjunctivae and EOM are normal. No scleral icterus.  Neck: Normal range of motion.  Cardiovascular: Normal rate, regular rhythm and intact distal pulses.   Pulmonary/Chest: Effort normal. No respiratory distress.  Respirations even and unlabored  Abdominal: Soft. He exhibits no distension and no mass. There is no guarding.  Soft abdomen. No anterior tenderness to palpation.  Musculoskeletal: Normal range of motion.  Neurological: He is alert and oriented to person, place, and time. He exhibits normal muscle tone. Coordination normal.  Skin: Skin is warm and dry. No rash noted. He is not diaphoretic. No erythema. No pallor.  Psychiatric: He has a normal mood and affect. His behavior is normal.  Nursing note and vitals reviewed.    ED Treatments / Results  Labs (all labs ordered are listed, but only abnormal results are displayed) Labs Reviewed  URINALYSIS, ROUTINE W REFLEX MICROSCOPIC - Abnormal; Notable for the following:       Result Value   APPearance CLOUDY (*)    Hgb urine dipstick LARGE (*)    Protein, ur 100 (*)    All other components within normal limits  I-STAT CHEM 8, ED - Abnormal; Notable for the following:    Glucose, Bld 103 (*)    All other components within normal limits    EKG  EKG Interpretation None       Radiology US Renal  Result Date: 06/26/2016 CLINICAL DATA:  Acute left flank pain and hematuria. EXAM: RENAL / URINARY TRACT ULTRASOUND COMPLETE COMPARISON:  None. FINDINGS: Right Kidney: Length: 10.9 cm. Echogenicity within normal limits. No mass or hydronephrosis visualized. Left Kidney: Length: 12.8 cm.  Mild hydronephrosis.  No calculi are visible. Bladder: Appears normal for degree of bladder distention.  IMPRESSION: Mild left hydronephrosis. Electronically Signed   By: Ellery Plunk M.D.   On: 06/26/2016 06:42    Procedures Procedures (including critical care time)  Medications Ordered in ED Medications  ketorolac (TORADOL) 30 MG/ML injection 30 mg (30 mg Intravenous Given 06/26/16 0546)  ondansetron (ZOFRAN) injection 4 mg (4 mg Intravenous Given 06/26/16 0546)  HYDROmorphone (DILAUDID) injection 1 mg (1 mg Intravenous Given 06/26/16 0546)     Initial Impression / Assessment and Plan / ED Course  I have reviewed the triage vital signs and the nursing notes.  Pertinent labs & imaging results that were available during my care of the patient were reviewed by me and considered in my medical decision making (see chart for details).     Pt has been diagnosed with a kidney stone.  There is no evidence of significant hydronephrosis, serum creatine WNL, vitals sign stable and the pt does not have irratractable vomiting. Pt will be discharged home with pain medications and has been advised to follow up with PCP. Return precautions provided at discharge. Patient agreeable to plan with no unaddressed concerns.   Final Clinical Impressions(s) / ED Diagnoses   Final diagnoses:  Kidney stone  Left flank pain    New Prescriptions New Prescriptions   IBUPROFEN (ADVIL,MOTRIN) 600 MG TABLET    Take 1 tablet (600 mg total) by mouth every 6 (six) hours as needed.   ONDANSETRON (ZOFRAN ODT) 4 MG DISINTEGRATING TABLET    Take 1 tablet (4 mg total) by mouth every 8 (eight) hours as needed for nausea or vomiting.   OXYCODONE-ACETAMINOPHEN (PERCOCET/ROXICET) 5-325 MG TABLET    Take 1-2 tablets by mouth every 6 (six) hours as needed for severe pain.   TAMSULOSIN (FLOMAX) 0.4 MG CAPS CAPSULE    Take 1 capsule (0.4 mg total) by mouth daily.     Antony Madura, PA-C 06/26/16 1610    Paula Libra, MD 06/26/16 [phone]

## 2017-01-15 DIAGNOSIS — D485 Neoplasm of uncertain behavior of skin: Secondary | ICD-10-CM | POA: Diagnosis not present

## 2017-01-24 DIAGNOSIS — D485 Neoplasm of uncertain behavior of skin: Secondary | ICD-10-CM | POA: Diagnosis not present

## 2017-01-24 DIAGNOSIS — D2239 Melanocytic nevi of other parts of face: Secondary | ICD-10-CM | POA: Diagnosis not present

## 2017-01-24 DIAGNOSIS — L720 Epidermal cyst: Secondary | ICD-10-CM | POA: Diagnosis not present

## 2017-04-23 DIAGNOSIS — F900 Attention-deficit hyperactivity disorder, predominantly inattentive type: Secondary | ICD-10-CM | POA: Diagnosis not present

## 2017-04-23 DIAGNOSIS — J111 Influenza due to unidentified influenza virus with other respiratory manifestations: Secondary | ICD-10-CM | POA: Diagnosis not present

## 2017-04-23 DIAGNOSIS — J0100 Acute maxillary sinusitis, unspecified: Secondary | ICD-10-CM | POA: Diagnosis not present

## 2017-07-26 DIAGNOSIS — J0100 Acute maxillary sinusitis, unspecified: Secondary | ICD-10-CM | POA: Diagnosis not present

## 2017-07-26 DIAGNOSIS — R110 Nausea: Secondary | ICD-10-CM | POA: Diagnosis not present

## 2017-07-26 DIAGNOSIS — R42 Dizziness and giddiness: Secondary | ICD-10-CM | POA: Diagnosis not present

## 2017-09-08 DIAGNOSIS — F900 Attention-deficit hyperactivity disorder, predominantly inattentive type: Secondary | ICD-10-CM | POA: Diagnosis not present

## 2017-09-08 DIAGNOSIS — M545 Low back pain: Secondary | ICD-10-CM | POA: Diagnosis not present

## 2017-09-09 DIAGNOSIS — M9903 Segmental and somatic dysfunction of lumbar region: Secondary | ICD-10-CM | POA: Diagnosis not present

## 2017-09-09 DIAGNOSIS — M9901 Segmental and somatic dysfunction of cervical region: Secondary | ICD-10-CM | POA: Diagnosis not present

## 2017-09-09 DIAGNOSIS — M47816 Spondylosis without myelopathy or radiculopathy, lumbar region: Secondary | ICD-10-CM | POA: Diagnosis not present

## 2017-09-09 DIAGNOSIS — M9902 Segmental and somatic dysfunction of thoracic region: Secondary | ICD-10-CM | POA: Diagnosis not present

## 2017-09-10 DIAGNOSIS — M47816 Spondylosis without myelopathy or radiculopathy, lumbar region: Secondary | ICD-10-CM | POA: Diagnosis not present

## 2017-09-10 DIAGNOSIS — M9901 Segmental and somatic dysfunction of cervical region: Secondary | ICD-10-CM | POA: Diagnosis not present

## 2017-09-10 DIAGNOSIS — M9903 Segmental and somatic dysfunction of lumbar region: Secondary | ICD-10-CM | POA: Diagnosis not present

## 2017-09-10 DIAGNOSIS — M9902 Segmental and somatic dysfunction of thoracic region: Secondary | ICD-10-CM | POA: Diagnosis not present

## 2017-09-15 DIAGNOSIS — M9901 Segmental and somatic dysfunction of cervical region: Secondary | ICD-10-CM | POA: Diagnosis not present

## 2017-09-15 DIAGNOSIS — M9902 Segmental and somatic dysfunction of thoracic region: Secondary | ICD-10-CM | POA: Diagnosis not present

## 2017-09-15 DIAGNOSIS — M9903 Segmental and somatic dysfunction of lumbar region: Secondary | ICD-10-CM | POA: Diagnosis not present

## 2017-09-15 DIAGNOSIS — M47816 Spondylosis without myelopathy or radiculopathy, lumbar region: Secondary | ICD-10-CM | POA: Diagnosis not present

## 2017-09-16 DIAGNOSIS — M47816 Spondylosis without myelopathy or radiculopathy, lumbar region: Secondary | ICD-10-CM | POA: Diagnosis not present

## 2017-09-16 DIAGNOSIS — M9903 Segmental and somatic dysfunction of lumbar region: Secondary | ICD-10-CM | POA: Diagnosis not present

## 2017-09-16 DIAGNOSIS — M9901 Segmental and somatic dysfunction of cervical region: Secondary | ICD-10-CM | POA: Diagnosis not present

## 2017-09-16 DIAGNOSIS — M9902 Segmental and somatic dysfunction of thoracic region: Secondary | ICD-10-CM | POA: Diagnosis not present

## 2017-09-17 DIAGNOSIS — M47816 Spondylosis without myelopathy or radiculopathy, lumbar region: Secondary | ICD-10-CM | POA: Diagnosis not present

## 2017-09-17 DIAGNOSIS — M9902 Segmental and somatic dysfunction of thoracic region: Secondary | ICD-10-CM | POA: Diagnosis not present

## 2017-09-17 DIAGNOSIS — M9901 Segmental and somatic dysfunction of cervical region: Secondary | ICD-10-CM | POA: Diagnosis not present

## 2017-09-17 DIAGNOSIS — M9903 Segmental and somatic dysfunction of lumbar region: Secondary | ICD-10-CM | POA: Diagnosis not present

## 2017-09-22 DIAGNOSIS — M47816 Spondylosis without myelopathy or radiculopathy, lumbar region: Secondary | ICD-10-CM | POA: Diagnosis not present

## 2017-09-22 DIAGNOSIS — M9901 Segmental and somatic dysfunction of cervical region: Secondary | ICD-10-CM | POA: Diagnosis not present

## 2017-09-22 DIAGNOSIS — M9903 Segmental and somatic dysfunction of lumbar region: Secondary | ICD-10-CM | POA: Diagnosis not present

## 2017-09-22 DIAGNOSIS — M9902 Segmental and somatic dysfunction of thoracic region: Secondary | ICD-10-CM | POA: Diagnosis not present

## 2017-09-23 DIAGNOSIS — M9902 Segmental and somatic dysfunction of thoracic region: Secondary | ICD-10-CM | POA: Diagnosis not present

## 2017-09-23 DIAGNOSIS — M9903 Segmental and somatic dysfunction of lumbar region: Secondary | ICD-10-CM | POA: Diagnosis not present

## 2017-09-23 DIAGNOSIS — M9901 Segmental and somatic dysfunction of cervical region: Secondary | ICD-10-CM | POA: Diagnosis not present

## 2017-09-23 DIAGNOSIS — M47816 Spondylosis without myelopathy or radiculopathy, lumbar region: Secondary | ICD-10-CM | POA: Diagnosis not present

## 2017-09-24 DIAGNOSIS — M9901 Segmental and somatic dysfunction of cervical region: Secondary | ICD-10-CM | POA: Diagnosis not present

## 2017-09-24 DIAGNOSIS — M9903 Segmental and somatic dysfunction of lumbar region: Secondary | ICD-10-CM | POA: Diagnosis not present

## 2017-09-24 DIAGNOSIS — M47816 Spondylosis without myelopathy or radiculopathy, lumbar region: Secondary | ICD-10-CM | POA: Diagnosis not present

## 2017-09-24 DIAGNOSIS — M9902 Segmental and somatic dysfunction of thoracic region: Secondary | ICD-10-CM | POA: Diagnosis not present

## 2017-09-29 DIAGNOSIS — M9901 Segmental and somatic dysfunction of cervical region: Secondary | ICD-10-CM | POA: Diagnosis not present

## 2017-09-29 DIAGNOSIS — M47816 Spondylosis without myelopathy or radiculopathy, lumbar region: Secondary | ICD-10-CM | POA: Diagnosis not present

## 2017-09-29 DIAGNOSIS — M9903 Segmental and somatic dysfunction of lumbar region: Secondary | ICD-10-CM | POA: Diagnosis not present

## 2017-09-29 DIAGNOSIS — M9902 Segmental and somatic dysfunction of thoracic region: Secondary | ICD-10-CM | POA: Diagnosis not present

## 2017-09-30 DIAGNOSIS — M9901 Segmental and somatic dysfunction of cervical region: Secondary | ICD-10-CM | POA: Diagnosis not present

## 2017-09-30 DIAGNOSIS — M9903 Segmental and somatic dysfunction of lumbar region: Secondary | ICD-10-CM | POA: Diagnosis not present

## 2017-09-30 DIAGNOSIS — M47816 Spondylosis without myelopathy or radiculopathy, lumbar region: Secondary | ICD-10-CM | POA: Diagnosis not present

## 2017-09-30 DIAGNOSIS — M9902 Segmental and somatic dysfunction of thoracic region: Secondary | ICD-10-CM | POA: Diagnosis not present

## 2017-10-01 DIAGNOSIS — M9901 Segmental and somatic dysfunction of cervical region: Secondary | ICD-10-CM | POA: Diagnosis not present

## 2017-10-01 DIAGNOSIS — M47816 Spondylosis without myelopathy or radiculopathy, lumbar region: Secondary | ICD-10-CM | POA: Diagnosis not present

## 2017-10-01 DIAGNOSIS — M9902 Segmental and somatic dysfunction of thoracic region: Secondary | ICD-10-CM | POA: Diagnosis not present

## 2017-10-01 DIAGNOSIS — M9903 Segmental and somatic dysfunction of lumbar region: Secondary | ICD-10-CM | POA: Diagnosis not present

## 2017-10-06 DIAGNOSIS — M47816 Spondylosis without myelopathy or radiculopathy, lumbar region: Secondary | ICD-10-CM | POA: Diagnosis not present

## 2017-10-06 DIAGNOSIS — M9902 Segmental and somatic dysfunction of thoracic region: Secondary | ICD-10-CM | POA: Diagnosis not present

## 2017-10-06 DIAGNOSIS — M9903 Segmental and somatic dysfunction of lumbar region: Secondary | ICD-10-CM | POA: Diagnosis not present

## 2017-10-06 DIAGNOSIS — M9901 Segmental and somatic dysfunction of cervical region: Secondary | ICD-10-CM | POA: Diagnosis not present

## 2017-10-07 DIAGNOSIS — M9902 Segmental and somatic dysfunction of thoracic region: Secondary | ICD-10-CM | POA: Diagnosis not present

## 2017-10-07 DIAGNOSIS — M9901 Segmental and somatic dysfunction of cervical region: Secondary | ICD-10-CM | POA: Diagnosis not present

## 2017-10-07 DIAGNOSIS — M9903 Segmental and somatic dysfunction of lumbar region: Secondary | ICD-10-CM | POA: Diagnosis not present

## 2017-10-07 DIAGNOSIS — M47816 Spondylosis without myelopathy or radiculopathy, lumbar region: Secondary | ICD-10-CM | POA: Diagnosis not present

## 2018-02-24 DIAGNOSIS — J0100 Acute maxillary sinusitis, unspecified: Secondary | ICD-10-CM | POA: Diagnosis not present

## 2018-02-24 DIAGNOSIS — J45909 Unspecified asthma, uncomplicated: Secondary | ICD-10-CM | POA: Diagnosis not present

## 2018-03-10 DIAGNOSIS — F900 Attention-deficit hyperactivity disorder, predominantly inattentive type: Secondary | ICD-10-CM | POA: Diagnosis not present

## 2018-04-01 IMAGING — US US RENAL
1 series · 14 of 25 positions shown · non-contrast
Comparison: None.

CLINICAL DATA: Acute left flank pain and hematuria.

EXAM:
RENAL / URINARY TRACT ULTRASOUND COMPLETE

[Series 1: us renal · 0.31mm/px · 14 of 36 slices shown]
[im 1/36]
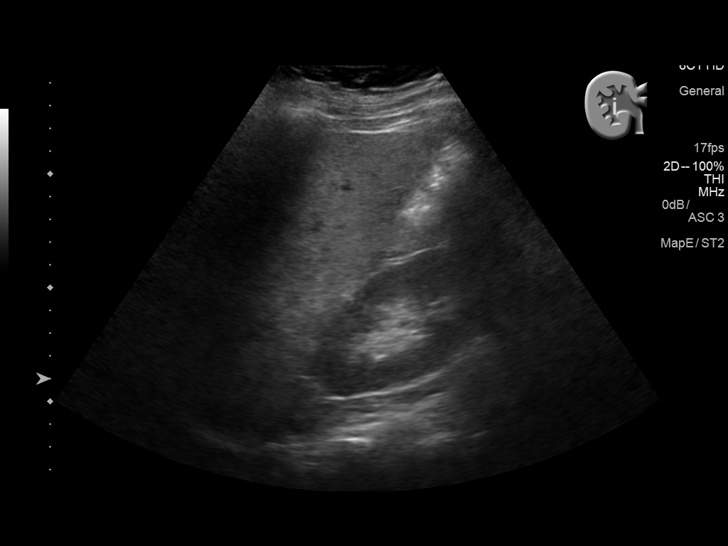
[im 3/36]
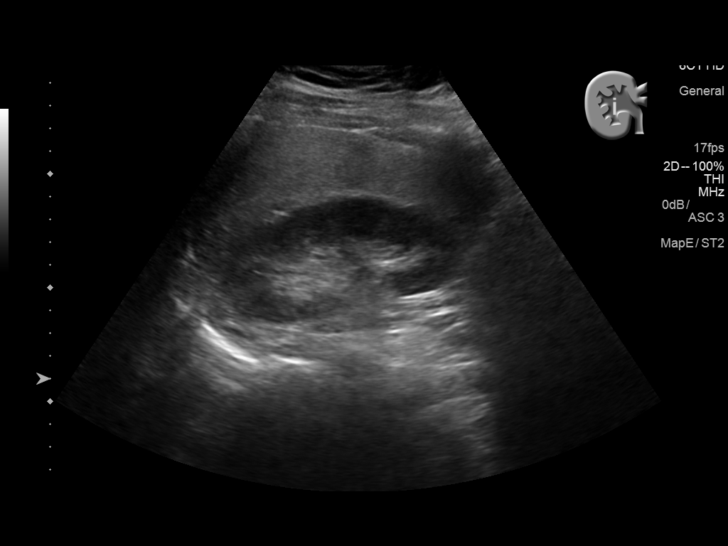
[im 6/36]
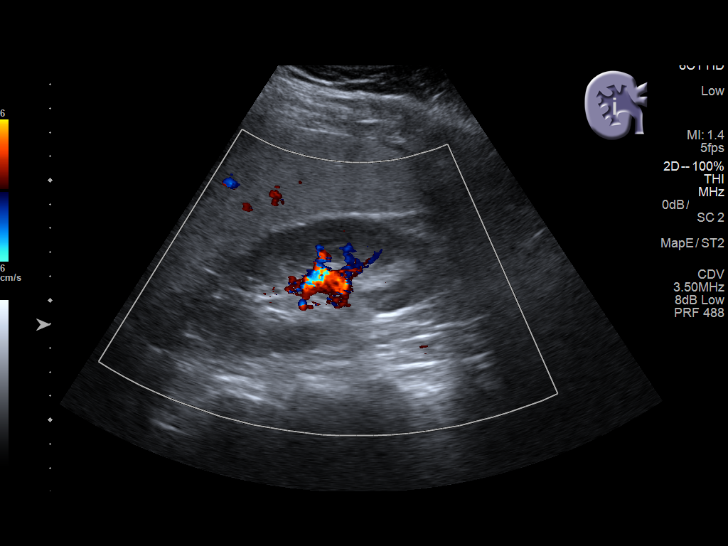
[im 9/36]
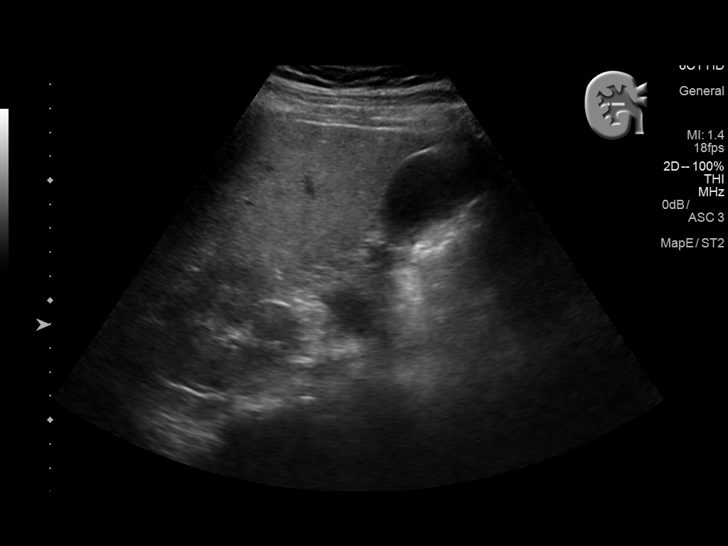
[im 12/36]
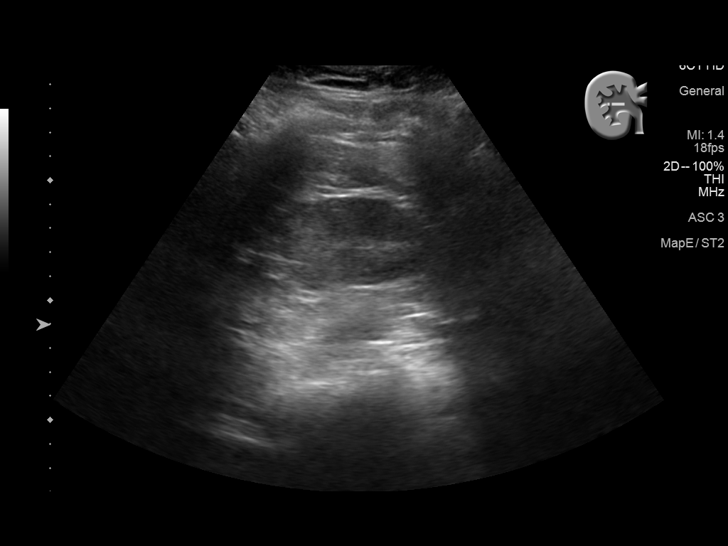
[im 14/36]
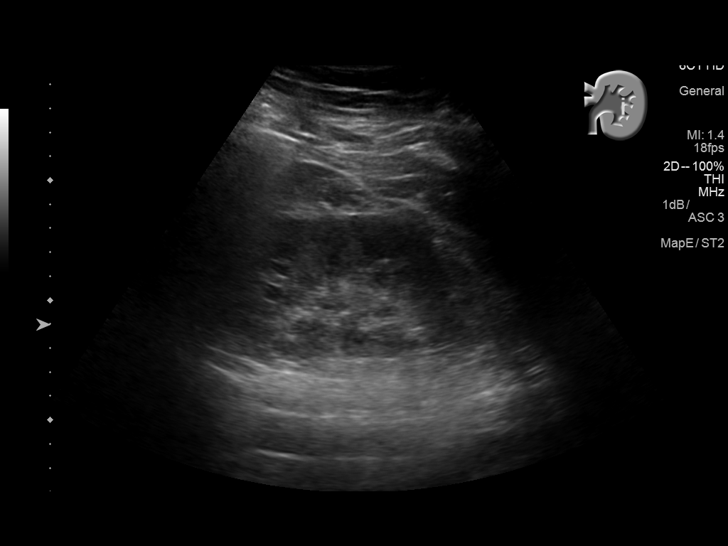
[im 17/36]
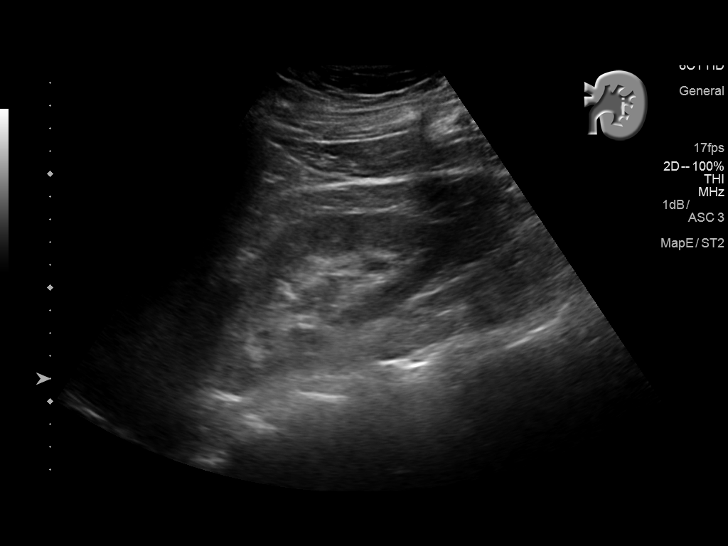
[im 19/36]
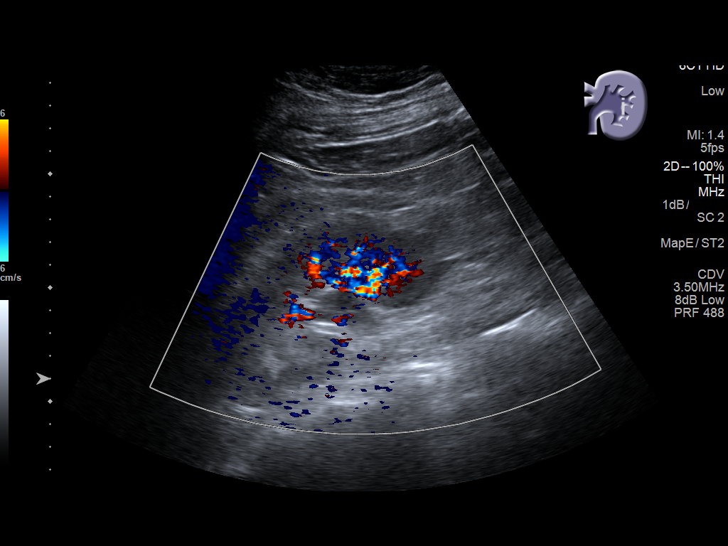
[im 22/36]
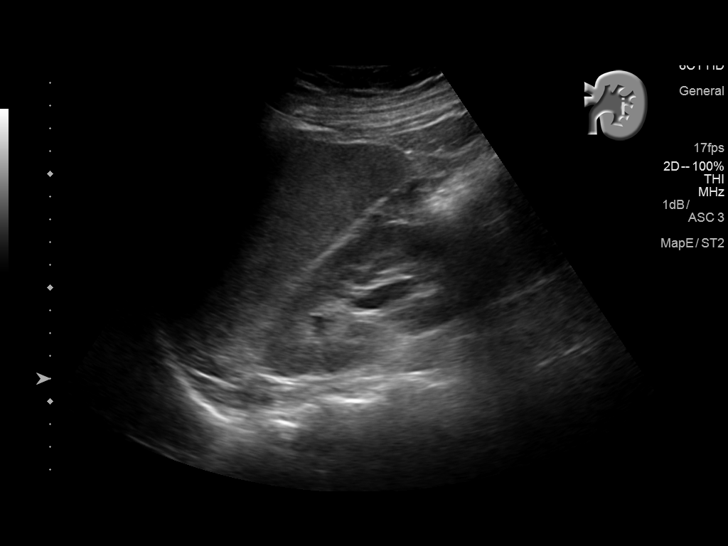
[im 24/36]
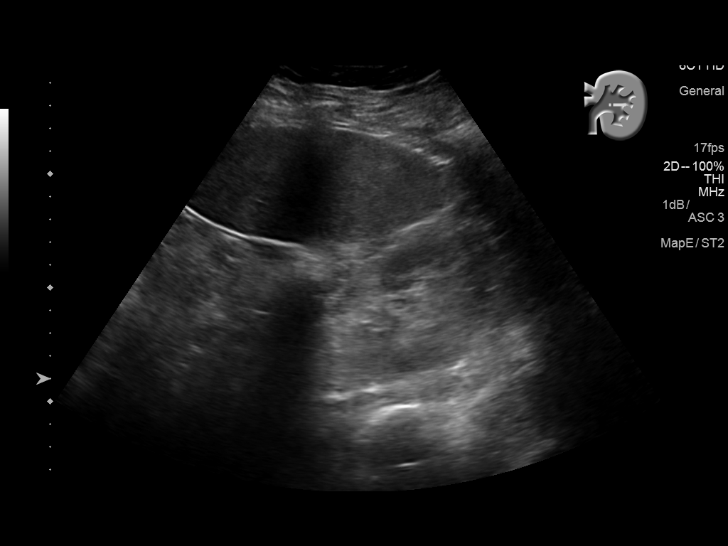
[im 27/36]
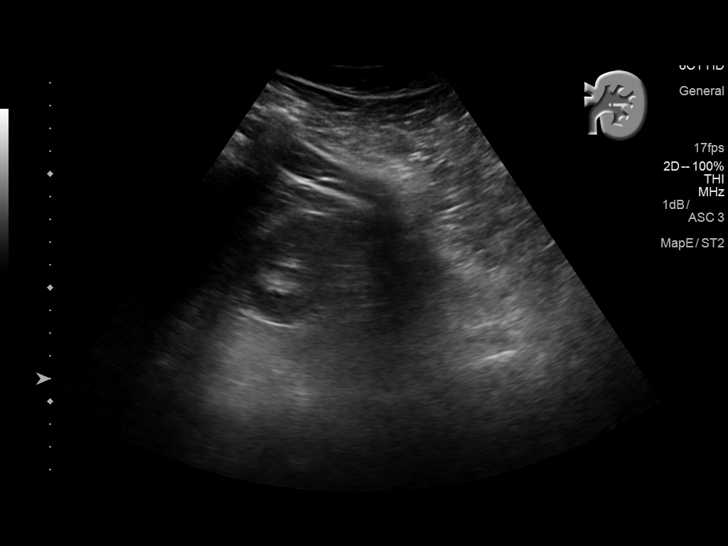
[im 30/36]
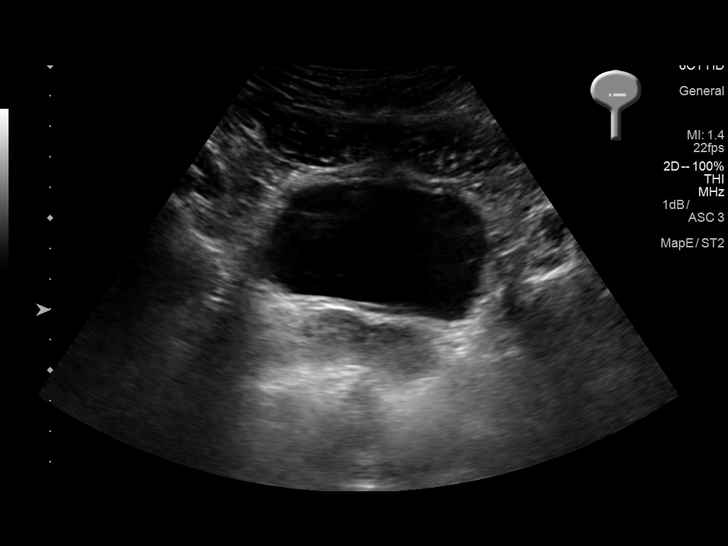
[im 33/36]
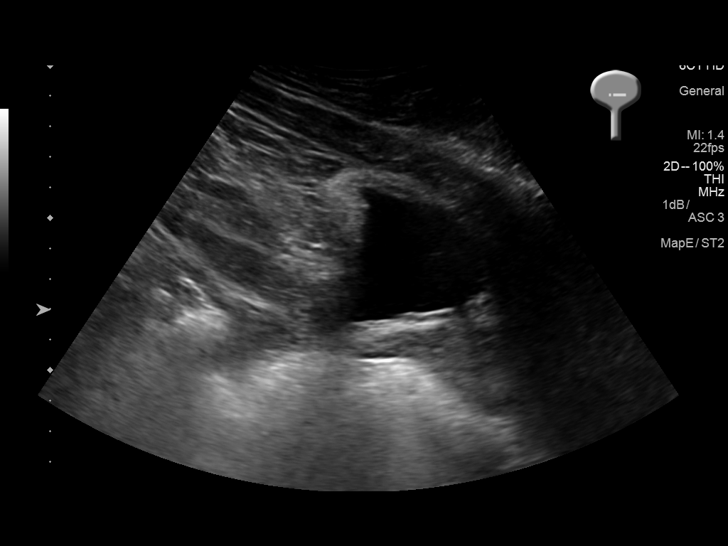
[im 36/36]
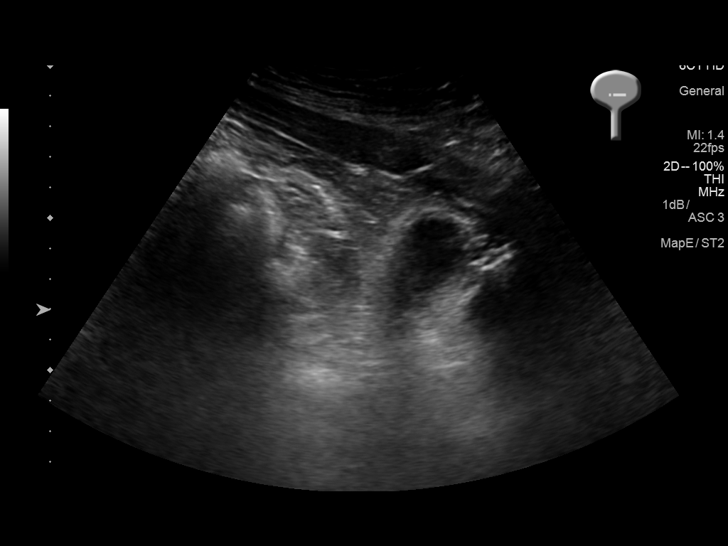

[14 of 25 positions shown; findings below may reference images not displayed]

FINDINGS: Right Kidney:

Length: 10.9 cm. Echogenicity within normal limits. No mass or
hydronephrosis visualized.

Left Kidney:

Length: 12.8 cm.  Mild hydronephrosis.  No calculi are visible.

Bladder:

Appears normal for degree of bladder distention.
IMPRESSION: Mild left hydronephrosis.

## 2018-04-13 DIAGNOSIS — H5203 Hypermetropia, bilateral: Secondary | ICD-10-CM | POA: Diagnosis not present

## 2018-04-15 DIAGNOSIS — S83281A Other tear of lateral meniscus, current injury, right knee, initial encounter: Secondary | ICD-10-CM | POA: Diagnosis not present

## 2018-04-20 DIAGNOSIS — M25561 Pain in right knee: Secondary | ICD-10-CM | POA: Diagnosis not present

## 2018-06-09 DIAGNOSIS — F900 Attention-deficit hyperactivity disorder, predominantly inattentive type: Secondary | ICD-10-CM | POA: Diagnosis not present

## 2018-06-09 DIAGNOSIS — F419 Anxiety disorder, unspecified: Secondary | ICD-10-CM | POA: Diagnosis not present

## 2018-06-09 DIAGNOSIS — F329 Major depressive disorder, single episode, unspecified: Secondary | ICD-10-CM | POA: Diagnosis not present

## 2018-07-21 DIAGNOSIS — M9902 Segmental and somatic dysfunction of thoracic region: Secondary | ICD-10-CM | POA: Diagnosis not present

## 2018-07-21 DIAGNOSIS — M9903 Segmental and somatic dysfunction of lumbar region: Secondary | ICD-10-CM | POA: Diagnosis not present

## 2018-07-21 DIAGNOSIS — M47816 Spondylosis without myelopathy or radiculopathy, lumbar region: Secondary | ICD-10-CM | POA: Diagnosis not present

## 2018-07-21 DIAGNOSIS — M9901 Segmental and somatic dysfunction of cervical region: Secondary | ICD-10-CM | POA: Diagnosis not present

## 2018-07-22 DIAGNOSIS — M47816 Spondylosis without myelopathy or radiculopathy, lumbar region: Secondary | ICD-10-CM | POA: Diagnosis not present

## 2018-07-22 DIAGNOSIS — M9902 Segmental and somatic dysfunction of thoracic region: Secondary | ICD-10-CM | POA: Diagnosis not present

## 2018-07-22 DIAGNOSIS — M9901 Segmental and somatic dysfunction of cervical region: Secondary | ICD-10-CM | POA: Diagnosis not present

## 2018-07-22 DIAGNOSIS — M9903 Segmental and somatic dysfunction of lumbar region: Secondary | ICD-10-CM | POA: Diagnosis not present

## 2018-07-24 ENCOUNTER — Encounter (HOSPITAL_BASED_OUTPATIENT_CLINIC_OR_DEPARTMENT_OTHER): Payer: Self-pay | Admitting: *Deleted

## 2018-07-24 ENCOUNTER — Other Ambulatory Visit: Payer: Self-pay

## 2018-07-24 DIAGNOSIS — M25561 Pain in right knee: Secondary | ICD-10-CM | POA: Diagnosis not present

## 2018-07-27 ENCOUNTER — Other Ambulatory Visit: Payer: Self-pay

## 2018-07-27 ENCOUNTER — Other Ambulatory Visit (HOSPITAL_COMMUNITY)
Admission: RE | Admit: 2018-07-27 | Discharge: 2018-07-27 | Disposition: A | Discharge status: Home / Self Care | Payer: BLUE CROSS/BLUE SHIELD | Source: Ambulatory Visit | Attending: Orthopaedic Surgery | Admitting: Orthopaedic Surgery

## 2018-07-27 DIAGNOSIS — Z1159 Encounter for screening for other viral diseases: Secondary | ICD-10-CM | POA: Insufficient documentation

## 2018-07-27 DIAGNOSIS — M9903 Segmental and somatic dysfunction of lumbar region: Secondary | ICD-10-CM | POA: Diagnosis not present

## 2018-07-27 DIAGNOSIS — M47816 Spondylosis without myelopathy or radiculopathy, lumbar region: Secondary | ICD-10-CM | POA: Diagnosis not present

## 2018-07-27 DIAGNOSIS — M9901 Segmental and somatic dysfunction of cervical region: Secondary | ICD-10-CM | POA: Diagnosis not present

## 2018-07-27 DIAGNOSIS — M9902 Segmental and somatic dysfunction of thoracic region: Secondary | ICD-10-CM | POA: Diagnosis not present

## 2018-07-28 ENCOUNTER — Other Ambulatory Visit (HOSPITAL_COMMUNITY): Admission: RE | Admit: 2018-07-28 | Payer: BLUE CROSS/BLUE SHIELD | Source: Ambulatory Visit

## 2018-07-28 DIAGNOSIS — M9903 Segmental and somatic dysfunction of lumbar region: Secondary | ICD-10-CM | POA: Diagnosis not present

## 2018-07-28 DIAGNOSIS — M9901 Segmental and somatic dysfunction of cervical region: Secondary | ICD-10-CM | POA: Diagnosis not present

## 2018-07-28 DIAGNOSIS — M9902 Segmental and somatic dysfunction of thoracic region: Secondary | ICD-10-CM | POA: Diagnosis not present

## 2018-07-28 DIAGNOSIS — M47816 Spondylosis without myelopathy or radiculopathy, lumbar region: Secondary | ICD-10-CM | POA: Diagnosis not present

## 2018-07-29 ENCOUNTER — Other Ambulatory Visit: Payer: Self-pay

## 2018-07-29 ENCOUNTER — Ambulatory Visit (HOSPITAL_BASED_OUTPATIENT_CLINIC_OR_DEPARTMENT_OTHER): Payer: BLUE CROSS/BLUE SHIELD | Admitting: Anesthesiology

## 2018-07-29 ENCOUNTER — Encounter (HOSPITAL_BASED_OUTPATIENT_CLINIC_OR_DEPARTMENT_OTHER)
Admission: RE | Disposition: A | Discharge status: Home / Self Care | Payer: Self-pay | Source: Home / Self Care | Attending: Orthopaedic Surgery

## 2018-07-29 ENCOUNTER — Ambulatory Visit (HOSPITAL_BASED_OUTPATIENT_CLINIC_OR_DEPARTMENT_OTHER)
Admission: RE | Admit: 2018-07-29 | Discharge: 2018-07-29 | Disposition: A | Discharge status: Home / Self Care | Payer: BLUE CROSS/BLUE SHIELD | Attending: Orthopaedic Surgery | Admitting: Orthopaedic Surgery

## 2018-07-29 ENCOUNTER — Encounter (HOSPITAL_BASED_OUTPATIENT_CLINIC_OR_DEPARTMENT_OTHER): Payer: Self-pay | Admitting: *Deleted

## 2018-07-29 DIAGNOSIS — S83281A Other tear of lateral meniscus, current injury, right knee, initial encounter: Secondary | ICD-10-CM | POA: Insufficient documentation

## 2018-07-29 DIAGNOSIS — F419 Anxiety disorder, unspecified: Secondary | ICD-10-CM | POA: Insufficient documentation

## 2018-07-29 DIAGNOSIS — X58XXXA Exposure to other specified factors, initial encounter: Secondary | ICD-10-CM | POA: Insufficient documentation

## 2018-07-29 DIAGNOSIS — Z79899 Other long term (current) drug therapy: Secondary | ICD-10-CM | POA: Insufficient documentation

## 2018-07-29 DIAGNOSIS — F329 Major depressive disorder, single episode, unspecified: Secondary | ICD-10-CM | POA: Insufficient documentation

## 2018-07-29 DIAGNOSIS — J45909 Unspecified asthma, uncomplicated: Secondary | ICD-10-CM | POA: Diagnosis not present

## 2018-07-29 DIAGNOSIS — F909 Attention-deficit hyperactivity disorder, unspecified type: Secondary | ICD-10-CM | POA: Diagnosis not present

## 2018-07-29 DIAGNOSIS — K4090 Unilateral inguinal hernia, without obstruction or gangrene, not specified as recurrent: Secondary | ICD-10-CM | POA: Diagnosis not present

## 2018-07-29 HISTORY — PX: KNEE ARTHROSCOPY WITH LATERAL MENISECTOMY: SHX6193

## 2018-07-29 LAB — NOVEL CORONAVIRUS, NAA (HOSP ORDER, SEND-OUT TO REF LAB; TAT 18-24 HRS): SARS-CoV-2, NAA: NOT DETECTED

## 2018-07-29 SURGERY — ARTHROSCOPY, KNEE, WITH LATERAL MENISCECTOMY
Anesthesia: General | Site: Knee | Laterality: Right

## 2018-07-29 MED ORDER — MELOXICAM 7.5 MG PO TABS: 7.5000 mg | ORAL_TABLET | Freq: Every day | ORAL | 2 refills | Status: DC

## 2018-07-29 MED ORDER — BUPIVACAINE-EPINEPHRINE (PF) 0.25% -1:200000 IJ SOLN
INTRAMUSCULAR | Status: AC
  Filled 2018-07-29: qty 30

## 2018-07-29 MED ORDER — SCOPOLAMINE 1 MG/3DAYS TD PT72: 1.0000 | MEDICATED_PATCH | Freq: Once | TRANSDERMAL | Status: DC | PRN

## 2018-07-29 MED ORDER — ACETAMINOPHEN 500 MG PO TABS: 1000.0000 mg | ORAL_TABLET | Freq: Three times a day (TID) | ORAL | 0 refills | Status: AC

## 2018-07-29 MED ORDER — DEXAMETHASONE SODIUM PHOSPHATE 4 MG/ML IJ SOLN
INTRAMUSCULAR | Status: DC | PRN
  Administered 2018-07-29: 10 mg via INTRAVENOUS

## 2018-07-29 MED ORDER — HYDROMORPHONE HCL 1 MG/ML IJ SOLN
0.2500 mg | INTRAMUSCULAR | Status: DC | PRN
  Administered 2018-07-29: 0.5 mg via INTRAVENOUS

## 2018-07-29 MED ORDER — KETOROLAC TROMETHAMINE 30 MG/ML IJ SOLN
INTRAMUSCULAR | Status: DC | PRN
  Administered 2018-07-29: 30 mg via INTRAVENOUS

## 2018-07-29 MED ORDER — LACTATED RINGERS IV SOLN
INTRAVENOUS | Status: DC
  Administered 2018-07-29: 10:00:00 via INTRAVENOUS

## 2018-07-29 MED ORDER — FENTANYL CITRATE (PF) 100 MCG/2ML IJ SOLN
50.0000 ug | INTRAMUSCULAR | Status: DC | PRN
  Administered 2018-07-29: 25 ug via INTRAVENOUS
  Administered 2018-07-29: 100 ug via INTRAVENOUS

## 2018-07-29 MED ORDER — SODIUM CHLORIDE 0.9 % IR SOLN
Status: DC | PRN
  Administered 2018-07-29: 2 mL

## 2018-07-29 MED ORDER — ASPIRIN EC 81 MG PO TBEC: 81.0000 mg | DELAYED_RELEASE_TABLET | Freq: Two times a day (BID) | ORAL | 0 refills | Status: DC

## 2018-07-29 MED ORDER — LIDOCAINE 2% (20 MG/ML) 5 ML SYRINGE
INTRAMUSCULAR | Status: AC
  Filled 2018-07-29: qty 5

## 2018-07-29 MED ORDER — CHLORHEXIDINE GLUCONATE 4 % EX LIQD: 60.0000 mL | Freq: Once | CUTANEOUS | Status: DC

## 2018-07-29 MED ORDER — ONDANSETRON HCL 4 MG/2ML IJ SOLN
INTRAMUSCULAR | Status: DC | PRN
  Administered 2018-07-29: 4 mg via INTRAVENOUS

## 2018-07-29 MED ORDER — ONDANSETRON HCL 4 MG PO TABS: 4.0000 mg | ORAL_TABLET | Freq: Three times a day (TID) | ORAL | 1 refills | Status: AC | PRN

## 2018-07-29 MED ORDER — MIDAZOLAM HCL 2 MG/2ML IJ SOLN
1.0000 mg | INTRAMUSCULAR | Status: DC | PRN
  Administered 2018-07-29: 2 mg via INTRAVENOUS

## 2018-07-29 MED ORDER — LIDOCAINE HCL (CARDIAC) PF 100 MG/5ML IV SOSY
PREFILLED_SYRINGE | INTRAVENOUS | Status: DC | PRN
  Administered 2018-07-29: 100 mg via INTRAVENOUS

## 2018-07-29 MED ORDER — CEFAZOLIN SODIUM-DEXTROSE 2-4 GM/100ML-% IV SOLN
INTRAVENOUS | Status: AC
  Filled 2018-07-29: qty 100

## 2018-07-29 MED ORDER — PROPOFOL 10 MG/ML IV BOLUS
INTRAVENOUS | Status: DC | PRN
  Administered 2018-07-29: 200 mg via INTRAVENOUS

## 2018-07-29 MED ORDER — HYDROMORPHONE HCL 1 MG/ML IJ SOLN
INTRAMUSCULAR | Status: AC
  Filled 2018-07-29: qty 0.5

## 2018-07-29 MED ORDER — ONDANSETRON HCL 4 MG/2ML IJ SOLN
INTRAMUSCULAR | Status: AC
  Filled 2018-07-29: qty 2

## 2018-07-29 MED ORDER — FENTANYL CITRATE (PF) 100 MCG/2ML IJ SOLN
INTRAMUSCULAR | Status: AC
  Filled 2018-07-29: qty 2

## 2018-07-29 MED ORDER — DEXAMETHASONE SODIUM PHOSPHATE 10 MG/ML IJ SOLN
INTRAMUSCULAR | Status: AC
  Filled 2018-07-29: qty 1

## 2018-07-29 MED ORDER — BUPIVACAINE HCL (PF) 0.25 % IJ SOLN
INTRAMUSCULAR | Status: AC
  Filled 2018-07-29: qty 30

## 2018-07-29 MED ORDER — OXYCODONE HCL 5 MG/5ML PO SOLN: 5.0000 mg | Freq: Once | ORAL | Status: DC | PRN

## 2018-07-29 MED ORDER — MIDAZOLAM HCL 2 MG/2ML IJ SOLN
INTRAMUSCULAR | Status: AC
  Filled 2018-07-29: qty 2

## 2018-07-29 MED ORDER — MEPERIDINE HCL 25 MG/ML IJ SOLN: 6.2500 mg | INTRAMUSCULAR | Status: DC | PRN

## 2018-07-29 MED ORDER — OXYCODONE HCL 5 MG PO TABS: ORAL_TABLET | ORAL | 0 refills | Status: AC

## 2018-07-29 MED ORDER — OXYCODONE HCL 5 MG PO TABS: 5.0000 mg | ORAL_TABLET | Freq: Once | ORAL | Status: DC | PRN

## 2018-07-29 MED ORDER — SODIUM CHLORIDE 0.9 % IR SOLN
Status: DC | PRN
  Administered 2018-07-29: 6000 mL

## 2018-07-29 MED ORDER — EPINEPHRINE PF 1 MG/ML IJ SOLN
INTRAMUSCULAR | Status: AC
  Filled 2018-07-29: qty 1

## 2018-07-29 MED ORDER — PROMETHAZINE HCL 25 MG/ML IJ SOLN: 6.2500 mg | INTRAMUSCULAR | Status: DC | PRN

## 2018-07-29 MED ORDER — CEFAZOLIN SODIUM-DEXTROSE 2-4 GM/100ML-% IV SOLN
2.0000 g | INTRAVENOUS | Status: AC
  Administered 2018-07-29: 2 g via INTRAVENOUS

## 2018-07-29 SURGICAL SUPPLY — 43 items
APL PRP STRL LF DISP 70% ISPRP (MISCELLANEOUS) ×2
APL SKNCLS STERI-STRIP NONHPOA (GAUZE/BANDAGES/DRESSINGS) ×2
BANDAGE ACE 6X5 VEL STRL LF (GAUZE/BANDAGES/DRESSINGS) ×3 IMPLANT
BANDAGE ESMARK 6X9 LF (GAUZE/BANDAGES/DRESSINGS) IMPLANT
BENZOIN TINCTURE PRP APPL 2/3 (GAUZE/BANDAGES/DRESSINGS) ×2 IMPLANT
BLADE CLIPPER SURG (BLADE) IMPLANT
BNDG CMPR 9X6 STRL LF SNTH (GAUZE/BANDAGES/DRESSINGS)
BNDG ESMARK 6X9 LF (GAUZE/BANDAGES/DRESSINGS)
CHLORAPREP W/TINT 26 (MISCELLANEOUS) ×3 IMPLANT
CLSR STERI-STRIP ANTIMIC 1/2X4 (GAUZE/BANDAGES/DRESSINGS) ×3 IMPLANT
CUFF TOURN SGL QUICK 34 (TOURNIQUET CUFF) ×3
CUFF TRNQT CYL 34X4.125X (TOURNIQUET CUFF) ×2 IMPLANT
DISSECTOR 3.5MM X 13CM CVD (MISCELLANEOUS) IMPLANT
DISSECTOR 4.0MMX13CM CVD (MISCELLANEOUS) ×2 IMPLANT
DISSECTOR 5.5MM X 13CM (MISCELLANEOUS) ×2 IMPLANT
DRAPE ARTHROSCOPY W/POUCH 90 (DRAPES) ×3 IMPLANT
DRAPE IMP U-DRAPE 54X76 (DRAPES) ×3 IMPLANT
DRAPE U-SHAPE 47X51 STRL (DRAPES) ×3 IMPLANT
GAUZE SPONGE 4X4 12PLY STRL (GAUZE/BANDAGES/DRESSINGS) ×3 IMPLANT
GLOVE BIO SURGEON STRL SZ8 (GLOVE) ×2 IMPLANT
GLOVE BIOGEL PI IND STRL 7.0 (GLOVE) ×1 IMPLANT
GLOVE BIOGEL PI IND STRL 8 (GLOVE) ×4 IMPLANT
GLOVE BIOGEL PI INDICATOR 7.0 (GLOVE) ×1
GLOVE BIOGEL PI INDICATOR 8 (GLOVE) ×3
GLOVE ECLIPSE 6.5 STRL STRAW (GLOVE) ×2 IMPLANT
GLOVE ECLIPSE 8.0 STRL XLNG CF (GLOVE) ×4 IMPLANT
GOWN STRL REUS W/ TWL LRG LVL3 (GOWN DISPOSABLE) ×2 IMPLANT
GOWN STRL REUS W/TWL LRG LVL3 (GOWN DISPOSABLE) ×3
GOWN STRL REUS W/TWL XL LVL3 (GOWN DISPOSABLE) ×3 IMPLANT
KIT TURNOVER KIT B (KITS) ×3 IMPLANT
MANIFOLD NEPTUNE II (INSTRUMENTS) ×2 IMPLANT
NDL SAFETY ECLIPSE 18X1.5 (NEEDLE) ×2 IMPLANT
NEEDLE HYPO 18GX1.5 SHARP (NEEDLE) ×3
NS IRRIG 1000ML POUR BTL (IV SOLUTION) IMPLANT
PACK ARTHROSCOPY DSU (CUSTOM PROCEDURE TRAY) ×3 IMPLANT
PORT APPOLLO RF 90DEGREE MULTI (SURGICAL WAND) IMPLANT
SLEEVE SCD COMPRESS KNEE MED (MISCELLANEOUS) ×3 IMPLANT
SUT MNCRL AB 4-0 PS2 18 (SUTURE) ×3 IMPLANT
SYR 5ML LUER SLIP (SYRINGE) ×3 IMPLANT
TOWEL GREEN STERILE FF (TOWEL DISPOSABLE) ×3 IMPLANT
TUBE CONNECTING 20X1/4 (TUBING) ×3 IMPLANT
TUBING ARTHROSCOPY IRRIG 16FT (MISCELLANEOUS) ×3 IMPLANT
WATER STERILE IRR 1000ML POUR (IV SOLUTION) ×3 IMPLANT

## 2018-07-29 NOTE — Discharge Instructions (Signed)

## 2018-07-29 NOTE — Anesthesia Procedure Notes (Signed)
Procedure Name: LMA Insertion Date/Time: 07/29/2018 11:29 AM Performed by: Gar Gibbon, CRNA Pre-anesthesia Checklist: Patient identified, Emergency Drugs available, Suction available and Patient being monitored Patient Re-evaluated:Patient Re-evaluated prior to induction Oxygen Delivery Method: Circle system utilized Preoxygenation: Pre-oxygenation with 100% oxygen Induction Type: IV induction Ventilation: Mask ventilation without difficulty LMA: LMA inserted LMA Size: 4.0 Number of attempts: 1 Airway Equipment and Method: Bite block Placement Confirmation: positive ETCO2 Tube secured with: Tape Dental Injury: Teeth and Oropharynx as per pre-operative assessment

## 2018-07-29 NOTE — H&P (Signed)
PREOPERATIVE H&P  Chief Complaint: TEAR OF LATERAL MENISCUS RIGHT KNEE  HPI: Chris Proctor is a 28 y.o. male who presents for preoperative history and physical with a diagnosis of TEAR OF LATERAL MENISCUS RIGHT KNEE. Symptoms are rated as moderate to severe, and have been worsening.  This is significantly impairing activities of daily living.  Please see my clinic note for full details on this patient's care.  He has elected for surgical management.   Past Medical History:  Diagnosis Date  . ADHD (attention deficit hyperactivity disorder)   . Anxiety   . Asthma   . Depression   . Headache    hx of migraines   . MRSA infection    hx of in 2006 and 2009   . PONV (postoperative nausea and vomiting)   . Renal disorder    Past Surgical History:  Procedure Laterality Date  . INGUINAL HERNIA REPAIR Left 12/05/2014   Procedure: LEFT INGUINAL HERNIA REPAIR WITH  MESH;  Surgeon: Darnell Levelodd Gerkin, MD;  Location: [facility];  Service: General;  Laterality: Left;  . INSERTION OF MESH Left 12/05/2014   Procedure: INSERTION OF MESH;  Surgeon: Darnell Levelodd Gerkin, MD;  Location: [facility];  Service: General;  Laterality: Left;  . MOUTH SURGERY     Social History   Socioeconomic History  . Marital status: Single    Spouse name: Not on file  . Number of children: Not on file  . Years of education: Not on file  . Highest education level: Not on file  Occupational History  . Not on file  Social Needs  . Financial resource strain: Not on file  . Food insecurity:    Worry: Not on file    Inability: Not on file  . Transportation needs:    Medical: Not on file    Non-medical: Not on file  Tobacco Use  . Smoking status: Never Smoker  . Smokeless tobacco: Never Used  Substance and Sexual Activity  . Alcohol use: Yes    Alcohol/week: 2.0 standard drinks    Types: 2 Cans of beer per week    Comment: 1-2 x per week or beer on weekend  . Drug use: No  . Sexual activity: Not on file  Lifestyle  . Physical  activity:    Days per week: Not on file    Minutes per session: Not on file  . Stress: Not on file  Relationships  . Social connections:    Talks on phone: Not on file    Gets together: Not on file    Attends religious service: Not on file    Active member of club or organization: Not on file    Attends meetings of clubs or organizations: Not on file    Relationship status: Not on file  Other Topics Concern  . Not on file  Social History Narrative  . Not on file   Family History  Problem Relation Age of Onset  . Hyperlipidemia Mother    No Known Allergies Prior to Admission medications   Medication Sig Start Date End Date Taking? Authorizing Provider  albuterol (PROVENTIL HFA;VENTOLIN HFA) 108 (90 BASE) MCG/ACT inhaler Inhale 1 puff into the lungs every 6 (six) hours as needed for wheezing or shortness of breath.   Yes [provider]  buPROPion (WELLBUTRIN XL) 150 MG 24 hr tablet Take 1 tablet by mouth daily. 06/05/16  Yes [provider]  methylphenidate (CONCERTA) 54 MG CR tablet Take 54 mg by mouth every morning.  Yes [provider]  sertraline (ZOLOFT) 100 MG tablet TAKE 1 1/2 TABLETS BY MOUTH ONCE A DAY 11/02/14  Yes [provider]     Positive ROS: All other systems have been reviewed and were otherwise negative with the exception of those mentioned in the HPI and as above.  Physical Exam: General: Alert, no acute distress Cardiovascular: No pedal edema Respiratory: No cyanosis, no use of accessory musculature GI: No organomegaly, abdomen is soft and non-tender Skin: No lesions in the area of chief complaint Neurologic: Sensation intact distally Psychiatric: Patient is competent for consent with normal mood and affect Lymphatic: No axillary or cervical lymphadenopathy  MUSCULOSKELETAL: R knee lateral ttp, fullness, good rom and lig stable.  Assessment: TEAR OF LATERAL MENISCUS RIGHT KNEE  Plan: Plan for Procedure(s): RIGHT  KNEE ARTHROSCOPY LATERAL MENISCUS REPAIR  The risks benefits and alternatives were discussed with the patient including but not limited to the risks of nonoperative treatment, versus surgical intervention including infection, bleeding, nerve injury,  blood clots, cardiopulmonary complications, morbidity, mortality, among others, and they were willing to proceed.   Bjorn Pippin, MD  07/29/2018 10:18 AM

## 2018-07-29 NOTE — Op Note (Signed)
Orthopaedic Surgery Operative Note (CSN: [id number])  [name]  1990-11-28 Date of Surgery: 07/29/2018   Diagnoses:  TEAR OF LATERAL MENISCUS RIGHT KNEE  Procedure: Arthroscopic lateral meniscectomy partial    Operative Finding Successful completion of planned procedure.  Joint was pristine throughout the medial compartment, patellofemoral compartment, notch, exam under anesthesia and gutters.  The only abnormality in the entirety of the joint was that there was a complex parrot-beak anterior lateral meniscus tear that was not amenable to repair.  We resected this back to a stable base there was a horizontal component that we had to make sure it was appropriately resected as the patient had a small cyst there previously formed.  40% total meniscal volume of the lateral meniscus was resected.  Post-operative plan: The patient will be weightbearing as tolerated.  The patient will be discharged home.  DVT prophylaxis aspirin 81 mg a twice daily.  Pain control with PRN pain medication preferring oral medicines.  Follow up plan will be scheduled in approximately 7 days for incision check.  Post-Op Diagnosis: Same Surgeons:Primary: Bjorn Pippin, MD Assistants: Janace Litten, OPAC Location: [facility] OR ROOM 2 Anesthesia: general  Antibiotics: Ancef 2g preop Tourniquet time: * No tourniquets in log * Estimated Blood Loss: minimal Complications: None Specimens: None Implants: * No implants in log *  Indications for Surgery:   Chris Proctor is a 28 y.o. male with continued lateral knee pain for many months refractory to injections and activity modification.  Benefits and risks of operative and nonoperative management were discussed prior to surgery with patient/guardian(s) and informed consent form was completed.  Specific risks including infection, need for additional surgery, continued pain, continued cyst formation, postoperative arthrosis and post meniscectomy syndrome   Procedure:   The  patient was identified in the preoperative holding area where the surgical site was marked. The patient was taken to the OR where a procedural timeout was called and the above noted anesthesia was induced.  The patient was positioned supine on a regular bed.  Preoperative antibiotics were dosed.  The patient's right knee was prepped and draped in the usual sterile fashion.  A second preoperative timeout was called.      Standard anterior anterior arthroscopic portals were made with spinal needle localization.  We took care not to disturb the fat pad.  At this point we cleared the joint is as noted above.  We noted a complex parrot-beak type anterior lateral meniscus tear with a horizontal component as well.  This was not amenable to repair.  Partial meniscectomy was performed with a shaver and biting devices including a reverse biting device.  This was taken back to a stable base and we attempted to debride the inferior flap to avoid continued buildup in regards to the patient's cyst anterolaterally.  The incision was thoroughly irrigated and closed in a multilayer fashion with absorbable sutures. A sterile dressing was placed.   The patient was awoken from general anesthesia and taken to the PACU in stable condition without complication.   Janace Litten, OPA-C, present and scrubbed throughout the case, critical for completion in a timely fashion, and for retraction, instrumentation, closure.

## 2018-07-29 NOTE — Transfer of Care (Signed)
Immediate Anesthesia Transfer of Care Note  Patient: Chris Proctor  Procedure(s) Performed: RIGHT KNEE ARTHROSCOPY (Right Knee)  Patient Location: PACU  Anesthesia Type:General  Level of Consciousness: sedated and patient cooperative  Airway & Oxygen Therapy: Patient Spontanous Breathing and Patient connected to nasal cannula oxygen  Post-op Assessment: Report given to RN and Post -op Vital signs reviewed and stable  Post vital signs: Reviewed and stable  Last Vitals:  Vitals Value Taken Time  BP 130/79 07/29/2018 12:19 PM  Temp    Pulse 81 07/29/2018 12:20 PM  Resp 16 07/29/2018 12:20 PM  SpO2 97 % 07/29/2018 12:20 PM    Last Pain:  Vitals:   07/29/18 0950  TempSrc: Oral  PainSc: 0-No pain         Complications: No apparent anesthesia complications

## 2018-07-29 NOTE — Anesthesia Preprocedure Evaluation (Signed)
Anesthesia Evaluation  Patient identified by MRN, date of birth, ID band Patient awake    Reviewed: Allergy & Precautions, NPO status , Patient's Chart, lab work & pertinent test results  History of Anesthesia Complications (+) PONV and history of anesthetic complications  Airway Mallampati: II  TM Distance: >3 FB Neck ROM: Full    Dental  (+) Dental Advisory Given, Teeth Intact   Pulmonary asthma ,    Pulmonary exam normal breath sounds clear to auscultation       Cardiovascular negative cardio ROS Normal cardiovascular exam Rhythm:Regular Rate:Normal     Neuro/Psych  Headaches, PSYCHIATRIC DISORDERS Anxiety Depression    GI/Hepatic negative GI ROS, Neg liver ROS,   Endo/Other  negative endocrine ROS  Renal/GU Renal disease     Musculoskeletal negative musculoskeletal ROS (+)   Abdominal (+) + obese,   Peds  Hematology negative hematology ROS (+)   Anesthesia Other Findings   Reproductive/Obstetrics                             Anesthesia Physical  Anesthesia Plan  ASA: II  Anesthesia Plan: General   Post-op Pain Management:    Induction: Intravenous  PONV Risk Score and Plan: 4 or greater and Ondansetron, Dexamethasone, Midazolam and Treatment may vary due to age or medical condition  Airway Management Planned: LMA  Additional Equipment: None  Intra-op Plan:   Post-operative Plan: Extubation in OR  Informed Consent: I have reviewed the patients History and Physical, chart, labs and discussed the procedure including the risks, benefits and alternatives for the proposed anesthesia with the patient or authorized representative who has indicated his/her understanding and acceptance.     Dental advisory given  Plan Discussed with: CRNA  Anesthesia Plan Comments:         Anesthesia Quick Evaluation

## 2018-07-29 NOTE — Anesthesia Postprocedure Evaluation (Signed)
Anesthesia Post Note  Patient: Draxton Parrow  Procedure(s) Performed: RIGHT KNEE ARTHROSCOPY (Right Knee)     Patient location during evaluation: PACU Anesthesia Type: General Level of consciousness: sedated and patient cooperative Pain management: pain level controlled Vital Signs Assessment: post-procedure vital signs reviewed and stable Respiratory status: spontaneous breathing Cardiovascular status: stable Anesthetic complications: no    Last Vitals:  Vitals:   07/29/18 1251 07/29/18 1300  BP:  135/77  Pulse: 82 80  Resp: 15 16  Temp:  36.9 C  SpO2: 100% 100%    Last Pain:  Vitals:   07/29/18 1251  TempSrc:   PainSc: 3                  Lewie Loron

## 2018-07-31 ENCOUNTER — Encounter (HOSPITAL_BASED_OUTPATIENT_CLINIC_OR_DEPARTMENT_OTHER): Payer: Self-pay | Admitting: Orthopaedic Surgery

## 2018-08-06 DIAGNOSIS — S83281D Other tear of lateral meniscus, current injury, right knee, subsequent encounter: Secondary | ICD-10-CM | POA: Diagnosis not present

## 2018-08-07 DIAGNOSIS — M25461 Effusion, right knee: Secondary | ICD-10-CM | POA: Diagnosis not present

## 2018-08-07 DIAGNOSIS — M25661 Stiffness of right knee, not elsewhere classified: Secondary | ICD-10-CM | POA: Diagnosis not present

## 2018-08-07 DIAGNOSIS — R262 Difficulty in walking, not elsewhere classified: Secondary | ICD-10-CM | POA: Diagnosis not present

## 2018-08-07 DIAGNOSIS — M6281 Muscle weakness (generalized): Secondary | ICD-10-CM | POA: Diagnosis not present

## 2018-08-10 DIAGNOSIS — M25661 Stiffness of right knee, not elsewhere classified: Secondary | ICD-10-CM | POA: Diagnosis not present

## 2018-08-10 DIAGNOSIS — M25461 Effusion, right knee: Secondary | ICD-10-CM | POA: Diagnosis not present

## 2018-08-10 DIAGNOSIS — M6281 Muscle weakness (generalized): Secondary | ICD-10-CM | POA: Diagnosis not present

## 2018-08-10 DIAGNOSIS — R262 Difficulty in walking, not elsewhere classified: Secondary | ICD-10-CM | POA: Diagnosis not present

## 2018-08-12 DIAGNOSIS — M25461 Effusion, right knee: Secondary | ICD-10-CM | POA: Diagnosis not present

## 2018-08-12 DIAGNOSIS — M6281 Muscle weakness (generalized): Secondary | ICD-10-CM | POA: Diagnosis not present

## 2018-08-12 DIAGNOSIS — M25661 Stiffness of right knee, not elsewhere classified: Secondary | ICD-10-CM | POA: Diagnosis not present

## 2018-08-12 DIAGNOSIS — R262 Difficulty in walking, not elsewhere classified: Secondary | ICD-10-CM | POA: Diagnosis not present

## 2018-08-17 DIAGNOSIS — R262 Difficulty in walking, not elsewhere classified: Secondary | ICD-10-CM | POA: Diagnosis not present

## 2018-08-17 DIAGNOSIS — M6281 Muscle weakness (generalized): Secondary | ICD-10-CM | POA: Diagnosis not present

## 2018-08-17 DIAGNOSIS — M25661 Stiffness of right knee, not elsewhere classified: Secondary | ICD-10-CM | POA: Diagnosis not present

## 2018-08-17 DIAGNOSIS — M25461 Effusion, right knee: Secondary | ICD-10-CM | POA: Diagnosis not present

## 2018-08-19 DIAGNOSIS — M25661 Stiffness of right knee, not elsewhere classified: Secondary | ICD-10-CM | POA: Diagnosis not present

## 2018-08-19 DIAGNOSIS — M6281 Muscle weakness (generalized): Secondary | ICD-10-CM | POA: Diagnosis not present

## 2018-08-19 DIAGNOSIS — R262 Difficulty in walking, not elsewhere classified: Secondary | ICD-10-CM | POA: Diagnosis not present

## 2018-08-19 DIAGNOSIS — M25461 Effusion, right knee: Secondary | ICD-10-CM | POA: Diagnosis not present

## 2018-08-24 DIAGNOSIS — M25461 Effusion, right knee: Secondary | ICD-10-CM | POA: Diagnosis not present

## 2018-08-24 DIAGNOSIS — M6281 Muscle weakness (generalized): Secondary | ICD-10-CM | POA: Diagnosis not present

## 2018-08-24 DIAGNOSIS — R262 Difficulty in walking, not elsewhere classified: Secondary | ICD-10-CM | POA: Diagnosis not present

## 2018-08-24 DIAGNOSIS — M25661 Stiffness of right knee, not elsewhere classified: Secondary | ICD-10-CM | POA: Diagnosis not present

## 2018-08-30 DIAGNOSIS — R030 Elevated blood-pressure reading, without diagnosis of hypertension: Secondary | ICD-10-CM | POA: Diagnosis not present

## 2018-08-30 DIAGNOSIS — J329 Chronic sinusitis, unspecified: Secondary | ICD-10-CM | POA: Diagnosis not present

## 2018-08-30 DIAGNOSIS — J0140 Acute pansinusitis, unspecified: Secondary | ICD-10-CM | POA: Diagnosis not present

## 2018-08-31 ENCOUNTER — Other Ambulatory Visit: Payer: Self-pay

## 2018-08-31 ENCOUNTER — Encounter (HOSPITAL_BASED_OUTPATIENT_CLINIC_OR_DEPARTMENT_OTHER): Payer: Self-pay | Admitting: *Deleted

## 2018-08-31 ENCOUNTER — Emergency Department (HOSPITAL_BASED_OUTPATIENT_CLINIC_OR_DEPARTMENT_OTHER)
Admission: EM | Admit: 2018-08-31 | Discharge: 2018-08-31 | Disposition: A | Discharge status: Home / Self Care | Payer: BC Managed Care – PPO | Attending: Emergency Medicine | Admitting: Emergency Medicine

## 2018-08-31 ENCOUNTER — Emergency Department (HOSPITAL_BASED_OUTPATIENT_CLINIC_OR_DEPARTMENT_OTHER): Payer: BC Managed Care – PPO

## 2018-08-31 DIAGNOSIS — R531 Weakness: Secondary | ICD-10-CM | POA: Diagnosis not present

## 2018-08-31 DIAGNOSIS — R000 Tachycardia, unspecified: Secondary | ICD-10-CM | POA: Diagnosis not present

## 2018-08-31 DIAGNOSIS — R002 Palpitations: Secondary | ICD-10-CM | POA: Diagnosis not present

## 2018-08-31 DIAGNOSIS — J45909 Unspecified asthma, uncomplicated: Secondary | ICD-10-CM | POA: Insufficient documentation

## 2018-08-31 LAB — BASIC METABOLIC PANEL
Anion gap: 9 (ref 5–15)
BUN: 16 mg/dL (ref 6–20)
CO2: 24 mmol/L (ref 22–32)
Calcium: 9.3 mg/dL (ref 8.9–10.3)
Chloride: 105 mmol/L (ref 98–111)
Creatinine, Ser: 0.95 mg/dL (ref 0.61–1.24)
GFR calc Af Amer: >60 mL/min (ref >60)
GFR calc non Af Amer: >60 mL/min (ref >60)
Glucose, Bld: 103 mg/dL — ABNORMAL HIGH (ref 70–99)
Potassium: 3.6 mmol/L (ref 3.5–5.1)
Sodium: 138 mmol/L (ref 135–145)

## 2018-08-31 LAB — CBC
HCT: 43.4 % (ref 39.0–52.0)
Hemoglobin: 14.8 g/dL (ref 13.0–17.0)
MCH: 28.7 pg (ref 26.0–34.0)
MCHC: 34.1 g/dL (ref 30.0–36.0)
MCV: 84.3 fL (ref 80.0–100.0)
Platelets: 240 10*3/uL (ref 150–400)
RBC: 5.15 MIL/uL (ref 4.22–5.81)
RDW: 13.2 % (ref 11.5–15.5)
WBC: 9.2 10*3/uL (ref 4.0–10.5)
nRBC: 0 % (ref 0.0–0.2)

## 2018-08-31 LAB — D-DIMER, QUANTITATIVE: D-Dimer, Quant: <0.27 ug/mL-FEU (ref 0.00–0.50)

## 2018-08-31 MED ORDER — SODIUM CHLORIDE 0.9 % IV BOLUS
1000.0000 mL | Freq: Once | INTRAVENOUS | Status: AC
  Administered 2018-08-31: 1000 mL via INTRAVENOUS

## 2018-08-31 NOTE — ED Triage Notes (Signed)
pt c/o palpations x 4 hrs

## 2018-08-31 NOTE — ED Provider Notes (Signed)
[facility] EMERGENCY DEPARTMENT Provider Note   CSN: [id number] Arrival date & time: 08/31/18  2023    History   Chief Complaint Chief Complaint  Patient presents with  . Palpitations    HPI Chris Proctor is a 28 y.o. male.     HPI Patient began to feel his heart racing around 4 hours prior to arrival.  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  States he thought he been drinking enough water however.  No shortness of breath but does feel a little fatigued.  No fevers.  No cough.  Decongestant 2 days ago but stopped it because he felt his heart racing at that time.  States he called his doctor and was told to come in the hospital because he had knee surgery a month ago.  No swelling the leg.  States he has been mobile.  No chest pain.  No trouble breathing.  Does not feel short of breath.  States he does feel as if he could have a sinus infection however.  States his left face feels full.  States some pain in the tooth. Past Medical History:  Diagnosis Date  . ADHD (attention deficit hyperactivity disorder)   . Anxiety   . Asthma   . Depression   . Headache    hx of migraines   . MRSA infection    hx of in 2006 and 2009   . PONV (postoperative nausea and vomiting)   . Renal disorder     Patient Active Problem List   Diagnosis Date Noted  . Reducible left inguinal hernia 12/04/2014    Past Surgical History:  Procedure Laterality Date  . INGUINAL HERNIA REPAIR Left 12/05/2014   Procedure: LEFT INGUINAL HERNIA REPAIR WITH  MESH;  Surgeon: Darnell Levelodd Gerkin, MD;  Location: [facility];  Service: General;  Laterality: Left;  . INSERTION OF MESH Left 12/05/2014   Procedure: INSERTION OF MESH;  Surgeon: Darnell Levelodd Gerkin, MD;  Location: [facility];  Service: General;  Laterality: Left;  . KNEE ARTHROSCOPY WITH LATERAL MENISECTOMY Right 07/29/2018   Procedure: RIGHT KNEE ARTHROSCOPY;  Surgeon: Bjorn PippinVarkey, Dax T, MD;  Location: [facility];   Service: Orthopedics;  Laterality: Right;  . MOUTH SURGERY          Home Medications    Prior to Admission medications   Medication Sig Start Date End Date Taking? Authorizing Provider  albuterol (PROVENTIL HFA;VENTOLIN HFA) 108 (90 BASE) MCG/ACT inhaler Inhale 1 puff into the lungs every 6 (six) hours as needed for wheezing or shortness of breath.    [provider]  buPROPion (WELLBUTRIN XL) 150 MG 24 hr tablet Take 1 tablet by mouth daily. 06/05/16   [provider]  meloxicam (MOBIC) 7.5 MG tablet Take 1 tablet (7.5 mg total) by mouth daily. 07/29/18 07/29/19  Bjorn PippinVarkey, Dax T, MD  methylphenidate (CONCERTA) 54 MG CR tablet Take 54 mg by mouth every morning.     [provider]  sertraline (ZOLOFT) 100 MG tablet TAKE 1 1/2 TABLETS BY MOUTH ONCE A DAY 11/02/14   [provider]    Family History Family History  Problem Relation Age of Onset  . Hyperlipidemia Mother     Social History Social History   Tobacco Use  . Smoking status: Never Smoker  . Smokeless tobacco: Never Used  Substance Use Topics  . Alcohol use: Yes    Alcohol/week: 2.0 standard drinks  Types: 2 Cans of beer per week    Comment: 1-2 x per week or beer on weekend  . Drug use: No     Allergies   Patient has no known allergies.   Review of Systems Review of Systems  Constitutional: Negative for appetite change and fever.  HENT: Positive for sinus pressure.   Respiratory: Negative for shortness of breath.   Cardiovascular: Positive for palpitations.  Gastrointestinal: Negative for abdominal pain.  Genitourinary: Negative for flank pain.  Musculoskeletal: Negative for back pain.  Skin: Negative for rash.  Neurological: Negative for weakness.  Psychiatric/Behavioral: Negative for confusion.     Physical Exam Updated Vital Signs BP (!) 176/107 (BP Location: Right Arm)   Pulse 91   Resp 17   Ht 5\' 11"  (1.803 m)   Wt 111.1 kg   SpO2 100%   BMI 34.17 kg/m    Physical Exam Vitals signs and nursing note reviewed.  HENT:     Head: Normocephalic.     Left Ear: Tympanic membrane normal.  Cardiovascular:     Rate and Rhythm: Regular rhythm. Tachycardia present.  Abdominal:     Tenderness: There is no abdominal tenderness.  Musculoskeletal:     Right lower leg: No edema.     Left lower leg: No edema.  Lymphadenopathy:     Cervical: No cervical adenopathy.  Skin:    General: Skin is warm.     Capillary Refill: Capillary refill takes less than 2 seconds.  Neurological:     Mental Status: He is alert. Mental status is at baseline.      ED Treatments / Results  Labs (all labs ordered are listed, but only abnormal results are displayed) Labs Reviewed  BASIC METABOLIC PANEL - Abnormal; Notable for the following components:      Result Value   Glucose, Bld 103 (*)    All other components within normal limits  D-DIMER, QUANTITATIVE (NOT AT [facility])  CBC    EKG EKG Interpretation  Date/Time:  Monday August 31 2018 20:32:17 EDT Ventricular Rate:  108 PR Interval:    QRS Duration: 90 QT Interval:  326 QTC Calculation: 437 R Axis:   48 Text Interpretation:  Sinus tachycardia Borderline T wave abnormalities Confirmed by Benjiman CorePickering, Mischa Brittingham [phone]([id number]) on 08/31/2018 8:35:50 PM   Radiology Dg Chest 2 View  Result Date: 08/31/2018 CLINICAL DATA:  Weakness. EXAM: CHEST - 2 VIEW COMPARISON:  None. FINDINGS: The heart size and mediastinal contours are within normal limits. Both lungs are clear. The visualized skeletal structures are unremarkable. IMPRESSION: No active cardiopulmonary disease. Electronically Signed   By: Katherine Mantlehristopher  Green M.D.   On: 08/31/2018 21:23    Procedures Procedures (including critical care time)  Medications Ordered in ED Medications  sodium chloride 0.9 % bolus 1,000 mL ( Intravenous Stopped 08/31/18 2224)     Initial Impression / Assessment and Plan / ED Course  I have reviewed the triage vital signs and the nursing  notes.  Pertinent labs & imaging results that were available during my care of the patient were reviewed by me and considered in my medical decision making (see chart for details).       Patient presented with tachycardia.  No fevers.  Some mild sinus problems.  States he had been working in a hot environment however.  Had been concerned about DVT since she had had a recent surgery.  Negative d-dimer and is low enough risk I think he can be ruled out for PE.  EKG reassuring.  Heart rate improved.  Initial hypertension.  Needs outpatient follow-up.  Discharge home.  Final Clinical Impressions(s) / ED Diagnoses   Final diagnoses:  Tachycardia    ED Discharge Orders    None       Davonna Belling, MD 08/31/18 2251

## 2018-08-31 NOTE — ED Notes (Signed)
Patient transported to X-ray 

## 2018-08-31 NOTE — Discharge Instructions (Addendum)
Your fast heart rate could have been due to dehydration.  You does not have a blood clot.  Your blood pressure was also elevated will need to be followed by her PCP.

## 2018-09-01 DIAGNOSIS — J019 Acute sinusitis, unspecified: Secondary | ICD-10-CM | POA: Diagnosis not present

## 2018-09-01 DIAGNOSIS — F900 Attention-deficit hyperactivity disorder, predominantly inattentive type: Secondary | ICD-10-CM | POA: Diagnosis not present

## 2018-09-01 DIAGNOSIS — F419 Anxiety disorder, unspecified: Secondary | ICD-10-CM | POA: Diagnosis not present

## 2018-09-01 DIAGNOSIS — F410 Panic disorder [episodic paroxysmal anxiety] without agoraphobia: Secondary | ICD-10-CM | POA: Diagnosis not present

## 2018-09-03 ENCOUNTER — Emergency Department (HOSPITAL_BASED_OUTPATIENT_CLINIC_OR_DEPARTMENT_OTHER): Payer: BC Managed Care – PPO

## 2018-09-03 ENCOUNTER — Encounter (HOSPITAL_BASED_OUTPATIENT_CLINIC_OR_DEPARTMENT_OTHER): Payer: Self-pay | Admitting: Emergency Medicine

## 2018-09-03 ENCOUNTER — Other Ambulatory Visit: Payer: Self-pay

## 2018-09-03 ENCOUNTER — Emergency Department (HOSPITAL_BASED_OUTPATIENT_CLINIC_OR_DEPARTMENT_OTHER)
Admission: EM | Admit: 2018-09-03 | Discharge: 2018-09-03 | Disposition: A | Discharge status: Home / Self Care | Payer: BC Managed Care – PPO | Attending: Emergency Medicine | Admitting: Emergency Medicine

## 2018-09-03 DIAGNOSIS — Z79899 Other long term (current) drug therapy: Secondary | ICD-10-CM | POA: Insufficient documentation

## 2018-09-03 DIAGNOSIS — J45909 Unspecified asthma, uncomplicated: Secondary | ICD-10-CM | POA: Insufficient documentation

## 2018-09-03 DIAGNOSIS — R1031 Right lower quadrant pain: Secondary | ICD-10-CM | POA: Diagnosis not present

## 2018-09-03 DIAGNOSIS — R109 Unspecified abdominal pain: Secondary | ICD-10-CM

## 2018-09-03 LAB — URINALYSIS, ROUTINE W REFLEX MICROSCOPIC
Bilirubin Urine: NEGATIVE
Glucose, UA: NEGATIVE mg/dL
Hgb urine dipstick: NEGATIVE
Ketones, ur: NEGATIVE mg/dL
Leukocytes,Ua: NEGATIVE
Nitrite: NEGATIVE
Protein, ur: NEGATIVE mg/dL
Specific Gravity, Urine: 1.02 (ref 1.005–1.030)
pH: 6.5 (ref 5.0–8.0)

## 2018-09-03 MED ORDER — ONDANSETRON 8 MG PO TBDP
8.0000 mg | ORAL_TABLET | Freq: Once | ORAL | Status: AC
  Administered 2018-09-03: 06:00:00 8 mg via ORAL
  Filled 2018-09-03: qty 1

## 2018-09-03 MED ORDER — ONDANSETRON HCL 4 MG/2ML IJ SOLN: 4.0000 mg | Freq: Once | INTRAMUSCULAR | Status: DC

## 2018-09-03 MED ORDER — TAMSULOSIN HCL 0.4 MG PO CAPS: 0.4000 mg | ORAL_CAPSULE | ORAL | Status: DC

## 2018-09-03 MED ORDER — KETOROLAC TROMETHAMINE 30 MG/ML IJ SOLN: 15.0000 mg | Freq: Once | INTRAMUSCULAR | Status: DC

## 2018-09-03 MED ORDER — ACETAMINOPHEN 500 MG PO TABS
1000.0000 mg | ORAL_TABLET | Freq: Once | ORAL | Status: AC
  Administered 2018-09-03: 06:00:00 1000 mg via ORAL
  Filled 2018-09-03: qty 2

## 2018-09-03 MED ORDER — KETOROLAC TROMETHAMINE 60 MG/2ML IM SOLN
30.0000 mg | Freq: Once | INTRAMUSCULAR | Status: AC
  Administered 2018-09-03: 30 mg via INTRAMUSCULAR
  Filled 2018-09-03: qty 2

## 2018-09-03 MED ORDER — MELOXICAM 7.5 MG PO TABS: 7.5000 mg | ORAL_TABLET | Freq: Every day | ORAL | 0 refills | Status: DC

## 2018-09-03 NOTE — ED Triage Notes (Signed)
Patient presents with complaints of right flank pain; states woke him up from sleeping; denies NVD; denies fever; seen here 3 days ago for dehydration. Denies hematuria.

## 2018-09-03 NOTE — ED Provider Notes (Signed)
[facility] EMERGENCY DEPARTMENT Provider Note   CSN: [id number] Arrival date & time: 09/03/18  [id number]     History   Chief Complaint Chief Complaint  Patient presents with  . Flank Pain    HPI Chris Proctor is a 28 y.o. male.     The history is provided by the patient.  Flank Pain This is a recurrent problem. The current episode started more than 2 days ago (3.5 days). The problem occurs constantly. The problem has been gradually worsening. Pertinent negatives include no chest pain, no abdominal pain, no headaches and no shortness of breath. Nothing aggravates the symptoms. Nothing relieves the symptoms. Treatments tried: naproxen from previous stone. The treatment provided no relief.  B flank pain since just after being seen in the ED on Monday for palpitations.  No f/c/r.  No hematuria,  No dysuria.  No n/v/d.  Today pain progressed on the right.  No constipation.    Past Medical History:  Diagnosis Date  . ADHD (attention deficit hyperactivity disorder)   . Anxiety   . Asthma   . Depression   . Headache    hx of migraines   . MRSA infection    hx of in 2006 and 2009   . PONV (postoperative nausea and vomiting)   . Renal disorder     Patient Active Problem List   Diagnosis Date Noted  . Reducible left inguinal hernia 12/04/2014    Past Surgical History:  Procedure Laterality Date  . INGUINAL HERNIA REPAIR Left 12/05/2014   Procedure: LEFT INGUINAL HERNIA REPAIR WITH  MESH;  Surgeon: Darnell Levelodd Gerkin, MD;  Location: [facility];  Service: General;  Laterality: Left;  . INSERTION OF MESH Left 12/05/2014   Procedure: INSERTION OF MESH;  Surgeon: Darnell Levelodd Gerkin, MD;  Location: [facility];  Service: General;  Laterality: Left;  . KNEE ARTHROSCOPY WITH LATERAL MENISECTOMY Right 07/29/2018   Procedure: RIGHT KNEE ARTHROSCOPY;  Surgeon: Bjorn PippinVarkey, Dax T, MD;  Location: [facility];  Service: Orthopedics;  Laterality: Right;  . MOUTH SURGERY          Home  Medications    Prior to Admission medications   Medication Sig Start Date End Date Taking? Authorizing Provider  albuterol (PROVENTIL HFA;VENTOLIN HFA) 108 (90 BASE) MCG/ACT inhaler Inhale 1 puff into the lungs every 6 (six) hours as needed for wheezing or shortness of breath.    [provider]  buPROPion (WELLBUTRIN XL) 150 MG 24 hr tablet Take 1 tablet by mouth daily. 06/05/16   [provider]  meloxicam (MOBIC) 7.5 MG tablet Take 1 tablet (7.5 mg total) by mouth daily. 07/29/18 07/29/19  Bjorn PippinVarkey, Dax T, MD  methylphenidate (CONCERTA) 54 MG CR tablet Take 54 mg by mouth every morning.     [provider]  sertraline (ZOLOFT) 100 MG tablet TAKE 1 1/2 TABLETS BY MOUTH ONCE A DAY 11/02/14   [provider]    Family History Family History  Problem Relation Age of Onset  . Hyperlipidemia Mother     Social History Social History   Tobacco Use  . Smoking status: Never Smoker  . Smokeless tobacco: Never Used  Substance Use Topics  . Alcohol use: Yes    Alcohol/week: 2.0 standard drinks    Types: 2 Cans of beer per week    Comment: 1-2 x per week or beer on weekend  . Drug use: No     Allergies   Patient has no known allergies.  Review of Systems Review of Systems  Constitutional: Negative for fever.  HENT: Negative for sore throat.   Respiratory: Negative for cough and shortness of breath.   Cardiovascular: Negative for chest pain.  Gastrointestinal: Negative for abdominal pain, constipation, diarrhea, nausea and vomiting.  Genitourinary: Positive for flank pain. Negative for difficulty urinating, dysuria, frequency, hematuria and testicular pain.  Neurological: Negative for headaches.  All other systems reviewed and are negative.    Physical Exam Updated Vital Signs BP (!) 142/91 (BP Location: Right Arm)   Pulse 90   Temp 98.3 F (36.8 C) (Oral)   Resp 18   Ht 5\' 11"  (1.803 m)   Wt 111 kg   SpO2 100%   BMI 34.13 kg/m   Physical  Exam Vitals signs and nursing note reviewed.  Constitutional:      General: He is not in acute distress.    Appearance: Normal appearance.  HENT:     Head: Normocephalic and atraumatic.     Nose: Nose normal.  Eyes:     Conjunctiva/sclera: Conjunctivae normal.     Pupils: Pupils are equal, round, and reactive to light.  Neck:     Musculoskeletal: Normal range of motion and neck supple.  Cardiovascular:     Rate and Rhythm: Normal rate and regular rhythm.     Pulses: Normal pulses.     Heart sounds: Normal heart sounds.  Pulmonary:     Effort: Pulmonary effort is normal.     Breath sounds: Normal breath sounds.  Abdominal:     General: Abdomen is flat. Bowel sounds are normal.     Tenderness: There is no abdominal tenderness. There is no guarding or rebound. Negative signs include Murphy's sign, Rovsing's sign and McBurney's sign.  Musculoskeletal: Normal range of motion.  Skin:    General: Skin is warm and dry.     Capillary Refill: Capillary refill takes less than 2 seconds.  Neurological:     General: No focal deficit present.     Mental Status: He is alert and oriented to person, place, and time.  Psychiatric:        Mood and Affect: Mood is anxious.      ED Treatments / Results  Labs (all labs ordered are listed, but only abnormal results are displayed) Results for orders placed or performed during the hospital encounter of 09/03/18  Urinalysis, Routine w reflex microscopic  Result Value Ref Range   Color, Urine YELLOW YELLOW   APPearance CLEAR CLEAR   Specific Gravity, Urine 1.020 1.005 - 1.030   pH 6.5 5.0 - 8.0   Glucose, UA NEGATIVE NEGATIVE mg/dL   Hgb urine dipstick NEGATIVE NEGATIVE   Bilirubin Urine NEGATIVE NEGATIVE   Ketones, ur NEGATIVE NEGATIVE mg/dL   Protein, ur NEGATIVE NEGATIVE mg/dL   Nitrite NEGATIVE NEGATIVE   Leukocytes,Ua NEGATIVE NEGATIVE   Dg Chest 2 View  Result Date: 08/31/2018 CLINICAL DATA:  Weakness. EXAM: CHEST - 2 VIEW  COMPARISON:  None. FINDINGS: The heart size and mediastinal contours are within normal limits. Both lungs are clear. The visualized skeletal structures are unremarkable. IMPRESSION: No active cardiopulmonary disease. Electronically Signed   By: Constance Holster M.D.   On: 08/31/2018 21:23   Ct Renal Stone Study  Result Date: 09/03/2018 CLINICAL DATA:  Right flank pain.  Pain woke the patient from sleep. EXAM: CT ABDOMEN AND PELVIS WITHOUT CONTRAST TECHNIQUE: Multidetector CT imaging of the abdomen and pelvis was performed following the standard protocol without IV contrast. COMPARISON:  CT of the abdomen and pelvis 06/26/2016. FINDINGS: Lower chest: Lung bases are clear without focal nodule, mass, or airspace disease. Heart size is normal. No significant pleural or pericardial effusion present. Hepatobiliary: There is diffuse fatty infiltration of the liver. No discrete lesions are present. Common bile duct and gallbladder normal. Pancreas: Unremarkable. No pancreatic ductal dilatation or surrounding inflammatory changes. Spleen: Spleen is mildly enlarged. No discrete lesions are present. There is no significant interval change. Adrenals/Urinary Tract: The adrenal glands are normal bilaterally. A 6 mm nonobstructing stone is present at the lower pole of the right kidney, increased in size from the prior study. No additional stones are present in either kidney. There is no hydronephrosis. No ureteral stone or obstruction is present. No inflammatory changes present about the ureters. The urinary bladder is within normal limits. Stomach/Bowel: The stomach and duodenum are within normal limits. Small bowel is normal. Appendix is visualized and normal. The ascending and transverse colon are within normal limits. Descending and sigmoid colon are normal. Vascular/Lymphatic: No significant vascular findings are present. No enlarged abdominal or pelvic lymph nodes. Reproductive: Prostate is unremarkable. Other: No  abdominal wall hernia or abnormality. No abdominopelvic ascites. Musculoskeletal: Vertebral body heights and alignment are maintained. No focal lytic or blastic lesions are present. Bony pelvis is normal. The hips are located and within normal limits. IMPRESSION: 1. Enlarging nonobstructive stone at the lower pole of the right kidney now measures 6 mm. 2. No additional nephrolithiasis or urinary tract obstruction. 3. No inflammatory changes about the right ureter to suggest recent passage of stone. 4. No other acute or focal abnormality of the abdomen to explain the patient's flank pain. Electronically Signed   By: Marin Robertshristopher  Mattern M.D.   On: 09/03/2018 05:48    Procedures Procedures (including critical care time)  Medications Ordered in ED Medications  ketorolac (TORADOL) 30 MG/ML injection 15 mg (has no administration in time range)  ketorolac (TORADOL) injection 30 mg (has no administration in time range)  acetaminophen (TYLENOL) tablet 1,000 mg (has no administration in time range)  ondansetron (ZOFRAN-ODT) disintegrating tablet 8 mg (has no administration in time range)     I suspect there is some underlying anxiety.  Patient has been ruled out for PE and stones this week.    Final Clinical Impressions(s) / ED Diagnoses   Return for intractable cough, coughing up blood,fevers >100.4 unrelieved by medication, shortness of breath, intractable vomiting, chest pain, shortness of breath, weakness,numbness, changes in speech, facial asymmetry,abdominal pain, passing out,Inability to tolerate liquids or food, cough, altered mental status or any concerns. No signs of systemic illness or infection. The patient is nontoxic-appearing on exam and vital signs are within normal limits.   I have reviewed the triage vital signs and the nursing notes. Pertinent labs &imaging results that were available during my care of the patient were reviewed by me and considered in my medical decision  making (see chart for details).  After history, exam, and medical workup I feel the patient has been appropriately medically screened and is safe for discharge home. Pertinent diagnoses were discussed with the patient. Patient was given return precautions    Verneice Caspers, MD 09/03/18 [id number]

## 2018-09-04 DIAGNOSIS — M25461 Effusion, right knee: Secondary | ICD-10-CM | POA: Diagnosis not present

## 2018-09-04 DIAGNOSIS — R262 Difficulty in walking, not elsewhere classified: Secondary | ICD-10-CM | POA: Diagnosis not present

## 2018-09-04 DIAGNOSIS — M25661 Stiffness of right knee, not elsewhere classified: Secondary | ICD-10-CM | POA: Diagnosis not present

## 2018-09-04 DIAGNOSIS — M6281 Muscle weakness (generalized): Secondary | ICD-10-CM | POA: Diagnosis not present

## 2018-09-14 DIAGNOSIS — F410 Panic disorder [episodic paroxysmal anxiety] without agoraphobia: Secondary | ICD-10-CM | POA: Diagnosis not present

## 2018-09-14 DIAGNOSIS — F900 Attention-deficit hyperactivity disorder, predominantly inattentive type: Secondary | ICD-10-CM | POA: Diagnosis not present

## 2018-09-14 DIAGNOSIS — J019 Acute sinusitis, unspecified: Secondary | ICD-10-CM | POA: Diagnosis not present

## 2018-10-21 DIAGNOSIS — F988 Other specified behavioral and emotional disorders with onset usually occurring in childhood and adolescence: Secondary | ICD-10-CM | POA: Diagnosis not present

## 2018-11-26 DIAGNOSIS — H6123 Impacted cerumen, bilateral: Secondary | ICD-10-CM | POA: Diagnosis not present

## 2018-12-07 DIAGNOSIS — L03312 Cellulitis of back [any part except buttock]: Secondary | ICD-10-CM | POA: Diagnosis not present

## 2018-12-07 DIAGNOSIS — Z23 Encounter for immunization: Secondary | ICD-10-CM | POA: Diagnosis not present

## 2018-12-17 DIAGNOSIS — L720 Epidermal cyst: Secondary | ICD-10-CM | POA: Diagnosis not present

## 2019-01-20 ENCOUNTER — Other Ambulatory Visit: Payer: Self-pay

## 2019-01-20 DIAGNOSIS — Z20828 Contact with and (suspected) exposure to other viral communicable diseases: Secondary | ICD-10-CM | POA: Diagnosis not present

## 2019-01-20 DIAGNOSIS — U071 COVID-19: Secondary | ICD-10-CM | POA: Diagnosis not present

## 2019-01-20 DIAGNOSIS — Z20822 Contact with and (suspected) exposure to covid-19: Secondary | ICD-10-CM

## 2019-01-21 LAB — NOVEL CORONAVIRUS, NAA: SARS-CoV-2, NAA: NOT DETECTED

## 2019-03-17 DIAGNOSIS — N200 Calculus of kidney: Secondary | ICD-10-CM | POA: Diagnosis not present

## 2019-04-23 DIAGNOSIS — N200 Calculus of kidney: Secondary | ICD-10-CM | POA: Diagnosis not present

## 2019-06-19 ENCOUNTER — Emergency Department (HOSPITAL_BASED_OUTPATIENT_CLINIC_OR_DEPARTMENT_OTHER): Payer: BC Managed Care – PPO

## 2019-06-19 ENCOUNTER — Encounter (HOSPITAL_BASED_OUTPATIENT_CLINIC_OR_DEPARTMENT_OTHER): Payer: Self-pay | Admitting: Emergency Medicine

## 2019-06-19 ENCOUNTER — Encounter (HOSPITAL_COMMUNITY): Payer: Self-pay

## 2019-06-19 ENCOUNTER — Emergency Department (HOSPITAL_BASED_OUTPATIENT_CLINIC_OR_DEPARTMENT_OTHER)
Admission: EM | Admit: 2019-06-19 | Discharge: 2019-06-19 | Disposition: A | Discharge status: Home / Self Care | Payer: BC Managed Care – PPO | Attending: Emergency Medicine | Admitting: Emergency Medicine

## 2019-06-19 ENCOUNTER — Other Ambulatory Visit: Payer: Self-pay

## 2019-06-19 DIAGNOSIS — R1031 Right lower quadrant pain: Secondary | ICD-10-CM | POA: Diagnosis present

## 2019-06-19 DIAGNOSIS — N132 Hydronephrosis with renal and ureteral calculous obstruction: Secondary | ICD-10-CM | POA: Diagnosis not present

## 2019-06-19 DIAGNOSIS — Z20822 Contact with and (suspected) exposure to covid-19: Secondary | ICD-10-CM | POA: Insufficient documentation

## 2019-06-19 DIAGNOSIS — R109 Unspecified abdominal pain: Secondary | ICD-10-CM | POA: Diagnosis not present

## 2019-06-19 DIAGNOSIS — Z79899 Other long term (current) drug therapy: Secondary | ICD-10-CM | POA: Insufficient documentation

## 2019-06-19 DIAGNOSIS — N201 Calculus of ureter: Secondary | ICD-10-CM | POA: Diagnosis not present

## 2019-06-19 HISTORY — DX: Calculus of ureter: N20.1

## 2019-06-19 LAB — URINALYSIS, ROUTINE W REFLEX MICROSCOPIC
Bilirubin Urine: NEGATIVE
Glucose, UA: NEGATIVE mg/dL
Ketones, ur: NEGATIVE mg/dL
Leukocytes,Ua: NEGATIVE
Nitrite: NEGATIVE
Protein, ur: NEGATIVE mg/dL
Specific Gravity, Urine: 1.025 (ref 1.005–1.030)
pH: 6.5 (ref 5.0–8.0)

## 2019-06-19 LAB — URINALYSIS, MICROSCOPIC (REFLEX)
Bacteria, UA: NONE SEEN
Squamous Epithelial / HPF: NONE SEEN (ref 0–5)

## 2019-06-19 MED ORDER — ONDANSETRON 8 MG PO TBDP: 8.0000 mg | ORAL_TABLET | Freq: Three times a day (TID) | ORAL | 1 refills | Status: DC | PRN

## 2019-06-19 MED ORDER — ONDANSETRON HCL 4 MG/2ML IJ SOLN
4.0000 mg | Freq: Once | INTRAMUSCULAR | Status: AC
  Administered 2019-06-19: 4 mg via INTRAVENOUS
  Filled 2019-06-19: qty 2

## 2019-06-19 MED ORDER — HYDROMORPHONE HCL 1 MG/ML IJ SOLN
1.0000 mg | Freq: Once | INTRAMUSCULAR | Status: AC
  Administered 2019-06-19: 1 mg via INTRAVENOUS
  Filled 2019-06-19: qty 1

## 2019-06-19 MED ORDER — TAMSULOSIN HCL 0.4 MG PO CAPS: ORAL_CAPSULE | ORAL | 0 refills | Status: DC

## 2019-06-19 MED ORDER — HYDROMORPHONE HCL 2 MG PO TABS: 2.0000 mg | ORAL_TABLET | ORAL | 0 refills | Status: DC | PRN

## 2019-06-19 NOTE — ED Triage Notes (Signed)
Patient presents with complaints of right side flank pain onset 2 days ago; states worsened this am; states took flomax and naproxen yesterday that states gave some relief.

## 2019-06-19 NOTE — ED Triage Notes (Signed)
Pt seen and dx with 67mm stone today. Given Rx and non are managing pain. Was told to come here by urology.

## 2019-06-19 NOTE — ED Provider Notes (Signed)
[facility] Provider Note: Georgena Spurling, MD, FACEP  CSN: [id number] MRN: [id number] ARRIVAL: 06/19/19 at [place]: [place]  Flank Pain   HISTORY OF PRESENT ILLNESS  06/19/19 6:20 AM Chris Proctor is a 29 y.o. male with right flank pain that began 2 days ago but worsened this morning.  He rates his pain as an 8 out of 10, sharp in nature.  He characterizes the pain as like previous ureterolithiasis.  He has taken Flomax and naproxen with partial relief.   Past Medical History:  Diagnosis Date  . ADHD (attention deficit hyperactivity disorder)   . Anxiety   . Asthma   . Depression   . Headache    hx of migraines   . MRSA infection    hx of in 2006 and 2009   . PONV (postoperative nausea and vomiting)   . Ureterolithiasis     Past Surgical History:  Procedure Laterality Date  . INGUINAL HERNIA REPAIR Left 12/05/2014   Procedure: LEFT INGUINAL HERNIA REPAIR WITH  MESH;  Surgeon: Armandina Gemma, MD;  Location: [facility];  Service: General;  Laterality: Left;  . INSERTION OF MESH Left 12/05/2014   Procedure: INSERTION OF MESH;  Surgeon: Armandina Gemma, MD;  Location: [facility];  Service: General;  Laterality: Left;  . KNEE ARTHROSCOPY WITH LATERAL MENISECTOMY Right 07/29/2018   Procedure: RIGHT KNEE ARTHROSCOPY;  Surgeon: Hiram Gash, MD;  Location: [facility];  Service: Orthopedics;  Laterality: Right;  . MOUTH SURGERY      Family History  Problem Relation Age of Onset  . Hyperlipidemia Mother     Social History   Tobacco Use  . Smoking status: Never Smoker  . Smokeless tobacco: Never Used  Substance Use Topics  . Alcohol use: Yes    Alcohol/week: 2.0 standard drinks    Types: 2 Cans of beer per week    Comment: 1-2 x per week or beer on weekend  . Drug use: No    Prior to Admission medications   Medication Sig Start Date End Date Taking? Authorizing Provider  albuterol (PROVENTIL HFA;VENTOLIN HFA) 108 (90 BASE) MCG/ACT  inhaler Inhale 1 puff into the lungs every 6 (six) hours as needed for wheezing or shortness of breath.    [provider]  buPROPion (WELLBUTRIN XL) 150 MG 24 hr tablet Take 1 tablet by mouth daily. 06/05/16   [provider]  HYDROmorphone (DILAUDID) 2 MG tablet Take 1 tablet (2 mg total) by mouth every 4 (four) hours as needed for severe pain. 06/19/19   Caddie Randle, MD  methylphenidate (CONCERTA) 54 MG CR tablet Take 36 mg by mouth every morning.     [provider]  ondansetron (ZOFRAN ODT) 8 MG disintegrating tablet Take 1 tablet (8 mg total) by mouth every 8 (eight) hours as needed for nausea or vomiting. 06/19/19   Abbrielle Batts, MD  sertraline (ZOLOFT) 100 MG tablet TAKE 1 1/2 TABLETS BY MOUTH ONCE A DAY 11/02/14   [provider]  tamsulosin (FLOMAX) 0.4 MG CAPS capsule Take 1 tablet daily until stone passes. 06/19/19   Koraline Phillipson, Jenny Reichmann, MD    Allergies Patient has no known allergies.   REVIEW OF SYSTEMS  Negative except as noted here or in the History of Present Illness.   PHYSICAL EXAMINATION  Initial Vital Signs Blood pressure (!) 150/92, pulse 85, temperature 98.3 F (36.8 C), temperature source Oral, resp. rate 18, height 5\' 11"  (1.803 m), weight  111 kg, SpO2 100 %.  Examination General: Well-developed, well-nourished male in no acute distress; appearance consistent with age of record HENT: normocephalic; atraumatic Eyes: pupils equal, round and reactive to light; extraocular muscles intact Neck: supple Heart: regular rate and rhythm; no murmurs, rubs or gallops Lungs: clear to auscultation bilaterally Abdomen: soft; nondistended; nontender; no masses or hepatosplenomegaly; bowel sounds present Extremities: No deformity; full range of motion; pulses normal Neurologic: Awake, alert and oriented; motor function intact in all extremities and symmetric; no facial droop Skin: Warm and dry Psychiatric: Normal mood and affect   RESULTS  Summary  of this visit's results, reviewed and interpreted by myself:   EKG Interpretation  Date/Time:    Ventricular Rate:    PR Interval:    QRS Duration:   QT Interval:    QTC Calculation:   R Axis:     Text Interpretation:        Laboratory Studies: Results for orders placed or performed during the hospital encounter of 06/19/19 (from the past 24 hour(s))  Urinalysis, Routine w reflex microscopic     Status: Abnormal   Collection Time: 06/19/19  6:18 AM  Result Value Ref Range   Color, Urine YELLOW YELLOW   APPearance CLEAR CLEAR   Specific Gravity, Urine 1.025 1.005 - 1.030   pH 6.5 5.0 - 8.0   Glucose, UA NEGATIVE NEGATIVE mg/dL   Hgb urine dipstick LARGE (A) NEGATIVE   Bilirubin Urine NEGATIVE NEGATIVE   Ketones, ur NEGATIVE NEGATIVE mg/dL   Protein, ur NEGATIVE NEGATIVE mg/dL   Nitrite NEGATIVE NEGATIVE   Leukocytes,Ua NEGATIVE NEGATIVE  Urinalysis, Microscopic (reflex)     Status: None   Collection Time: 06/19/19  6:18 AM  Result Value Ref Range   RBC / HPF 21-50 0 - 5 RBC/hpf   WBC, UA 0-5 0 - 5 WBC/hpf   Bacteria, UA NONE SEEN NONE SEEN   Squamous Epithelial / LPF NONE SEEN 0 - 5   Mucus PRESENT    Imaging Studies: CT Renal Stone Study  Result Date: 06/19/2019 CLINICAL DATA:  29 year old male with history of right-sided flank pain for the past 2 days, worsening this morning. EXAM: CT ABDOMEN AND PELVIS WITHOUT CONTRAST TECHNIQUE: Multidetector CT imaging of the abdomen and pelvis was performed following the standard protocol without IV contrast. COMPARISON:  CT the abdomen and pelvis 09/03/2018. FINDINGS: Lower chest: Unremarkable. Hepatobiliary: Diffuse low attenuation throughout the hepatic parenchyma, indicative of hepatic steatosis. No definite suspicious cystic or solid hepatic lesions are confidently identified on today's noncontrast CT examination. Unenhanced appearance of the gallbladder is normal. Pancreas: No definite pancreatic mass or peripancreatic fluid  collections or inflammatory changes are noted on today's noncontrast CT examination. Spleen: Unremarkable. Adrenals/Urinary Tract: 8 mm calculus in the proximal third of the right ureter with mild to moderate proximal right hydroureteronephrosis. No additional calculi are noted within the collecting system of either kidney, along the course of the left ureter or within the lumen of the urinary bladder. No left hydroureteronephrosis. Unenhanced appearance of the kidneys and bilateral adrenal glands is otherwise normal. Unenhanced appearance of the urinary bladder is normal. Stomach/Bowel: Unenhanced appearance of the stomach is normal. No pathologic dilatation of small bowel or colon. Normal appendix. Vascular/Lymphatic: No atherosclerotic calcifications in the abdominal aorta or pelvic vasculature. No lymphadenopathy noted in the abdomen or pelvis. Reproductive: Prostate gland and seminal vesicles are unremarkable in appearance. Other: No significant volume of ascites.  No pneumoperitoneum. Musculoskeletal: There are no aggressive appearing lytic or  blastic lesions noted in the visualized portions of the skeleton. IMPRESSION: 1. 8 mm obstructing calculus in the proximal third of the right ureter with mild to moderate proximal right hydroureteronephrosis. 2. Hepatic steatosis. Electronically Signed   By: Trudie Reed M.D.   On: 06/19/2019 07:34    ED COURSE and MDM  Nursing notes, initial and subsequent vitals signs, including pulse oximetry, reviewed and interpreted by myself.  Vitals:   06/19/19 0612 06/19/19 0615  BP:  (!) 150/92  Pulse:  85  Resp:  18  Temp:  98.3 F (36.8 C)  TempSrc:  Oral  SpO2:  100%  Weight: 111 kg   Height: 5\' 11"  (1.803 m)    Medications  ondansetron (ZOFRAN) injection 4 mg (4 mg Intravenous Given 06/19/19 0703)  HYDROmorphone (DILAUDID) injection 1 mg (1 mg Intravenous Given 06/19/19 0704)   7:11 AM Large stone seen in right ureter.  Patient is established with  Mr. 06/21/19 at [facility] u[facility].  We will provide prescriptions and have him follow-up for definitive treatment.  Given the size of the stone he may need urologic intervention.   PROCEDURES  Procedures   ED DIAGNOSES     ICD-10-CM   1. Ureterolithiasis  N20.1        Kell Ferris, MD 06/19/19 [phone]

## 2019-06-20 ENCOUNTER — Emergency Department (HOSPITAL_COMMUNITY): Payer: BC Managed Care – PPO

## 2019-06-20 ENCOUNTER — Emergency Department (HOSPITAL_COMMUNITY)
Admission: EM | Admit: 2019-06-20 | Discharge: 2019-06-20 | Disposition: A | Discharge status: Home / Self Care | Payer: BC Managed Care – PPO | Source: Home / Self Care | Attending: Emergency Medicine | Admitting: Emergency Medicine

## 2019-06-20 ENCOUNTER — Encounter (HOSPITAL_COMMUNITY)
Admission: EM | Disposition: A | Discharge status: Home / Self Care | Payer: Self-pay | Source: Home / Self Care | Attending: Emergency Medicine

## 2019-06-20 ENCOUNTER — Emergency Department (HOSPITAL_COMMUNITY): Payer: BC Managed Care – PPO | Admitting: Certified Registered Nurse Anesthetist

## 2019-06-20 DIAGNOSIS — R109 Unspecified abdominal pain: Secondary | ICD-10-CM

## 2019-06-20 DIAGNOSIS — N23 Unspecified renal colic: Secondary | ICD-10-CM

## 2019-06-20 DIAGNOSIS — F418 Other specified anxiety disorders: Secondary | ICD-10-CM | POA: Diagnosis not present

## 2019-06-20 DIAGNOSIS — N201 Calculus of ureter: Secondary | ICD-10-CM | POA: Diagnosis not present

## 2019-06-20 DIAGNOSIS — J45909 Unspecified asthma, uncomplicated: Secondary | ICD-10-CM | POA: Diagnosis not present

## 2019-06-20 HISTORY — PX: CYSTOSCOPY WITH STENT PLACEMENT: SHX5790

## 2019-06-20 LAB — BASIC METABOLIC PANEL
Anion gap: 10 (ref 5–15)
BUN: 25 mg/dL — ABNORMAL HIGH (ref 6–20)
CO2: 26 mmol/L (ref 22–32)
Calcium: 9.2 mg/dL (ref 8.9–10.3)
Chloride: 102 mmol/L (ref 98–111)
Creatinine, Ser: 1.23 mg/dL (ref 0.61–1.24)
GFR calc Af Amer: >60 mL/min (ref >60)
GFR calc non Af Amer: >60 mL/min (ref >60)
Glucose, Bld: 97 mg/dL (ref 70–99)
Potassium: 4.2 mmol/L (ref 3.5–5.1)
Sodium: 138 mmol/L (ref 135–145)

## 2019-06-20 LAB — CBC WITH DIFFERENTIAL/PLATELET
Abs Immature Granulocytes: 0.06 10*3/uL (ref 0.00–0.07)
Basophils Absolute: 0.1 10*3/uL (ref 0.0–0.1)
Basophils Relative: 1 %
Eosinophils Absolute: 0.4 10*3/uL (ref 0.0–0.5)
Eosinophils Relative: 4 %
HCT: 45.5 % (ref 39.0–52.0)
Hemoglobin: 15.3 g/dL (ref 13.0–17.0)
Immature Granulocytes: 1 %
Lymphocytes Relative: 24 %
Lymphs Abs: 2.3 10*3/uL (ref 0.7–4.0)
MCH: 29.2 pg (ref 26.0–34.0)
MCHC: 33.6 g/dL (ref 30.0–36.0)
MCV: 86.8 fL (ref 80.0–100.0)
Monocytes Absolute: 0.7 10*3/uL (ref 0.1–1.0)
Monocytes Relative: 7 %
Neutro Abs: 5.8 10*3/uL (ref 1.7–7.7)
Neutrophils Relative %: 63 %
Platelets: 200 10*3/uL (ref 150–400)
RBC: 5.24 MIL/uL (ref 4.22–5.81)
RDW: 13.6 % (ref 11.5–15.5)
WBC: 9.2 10*3/uL (ref 4.0–10.5)
nRBC: 0 % (ref 0.0–0.2)

## 2019-06-20 LAB — URINALYSIS, ROUTINE W REFLEX MICROSCOPIC
Bacteria, UA: NONE SEEN
Bilirubin Urine: NEGATIVE
Glucose, UA: NEGATIVE mg/dL
Ketones, ur: NEGATIVE mg/dL
Leukocytes,Ua: NEGATIVE
Nitrite: NEGATIVE
Protein, ur: NEGATIVE mg/dL
Specific Gravity, Urine: 1.015 (ref 1.005–1.030)
pH: 6 (ref 5.0–8.0)

## 2019-06-20 LAB — RESPIRATORY PANEL BY RT PCR (FLU A&B, COVID)
Influenza A by PCR: NEGATIVE
Influenza B by PCR: NEGATIVE
SARS Coronavirus 2 by RT PCR: NEGATIVE

## 2019-06-20 SURGERY — CYSTOSCOPY, WITH STENT INSERTION
Anesthesia: General | Site: Ureter | Laterality: Right

## 2019-06-20 MED ORDER — ONDANSETRON HCL 4 MG/2ML IJ SOLN
INTRAMUSCULAR | Status: AC
  Filled 2019-06-20: qty 2

## 2019-06-20 MED ORDER — LACTATED RINGERS IV SOLN: INTRAVENOUS | Status: DC

## 2019-06-20 MED ORDER — MEPERIDINE HCL 50 MG/ML IJ SOLN: 6.2500 mg | INTRAMUSCULAR | Status: DC | PRN

## 2019-06-20 MED ORDER — LIDOCAINE 2% (20 MG/ML) 5 ML SYRINGE
INTRAMUSCULAR | Status: AC
  Filled 2019-06-20: qty 5

## 2019-06-20 MED ORDER — HYDROMORPHONE HCL 1 MG/ML IJ SOLN
1.0000 mg | Freq: Once | INTRAMUSCULAR | Status: AC
  Administered 2019-06-20: 1 mg via INTRAVENOUS
  Filled 2019-06-20: qty 1

## 2019-06-20 MED ORDER — ONDANSETRON HCL 4 MG/2ML IJ SOLN: 4.0000 mg | Freq: Once | INTRAMUSCULAR | Status: DC | PRN

## 2019-06-20 MED ORDER — PROPOFOL 10 MG/ML IV BOLUS
INTRAVENOUS | Status: AC
  Filled 2019-06-20: qty 40

## 2019-06-20 MED ORDER — DEXAMETHASONE SODIUM PHOSPHATE 10 MG/ML IJ SOLN
INTRAMUSCULAR | Status: AC
  Filled 2019-06-20: qty 1

## 2019-06-20 MED ORDER — SCOPOLAMINE 1 MG/3DAYS TD PT72
MEDICATED_PATCH | TRANSDERMAL | Status: AC
  Filled 2019-06-20: qty 1

## 2019-06-20 MED ORDER — PHENYLEPHRINE HCL (PRESSORS) 10 MG/ML IV SOLN
INTRAVENOUS | Status: AC
  Filled 2019-06-20: qty 1

## 2019-06-20 MED ORDER — LIDOCAINE 2% (20 MG/ML) 5 ML SYRINGE
INTRAMUSCULAR | Status: DC | PRN
  Administered 2019-06-20: 50 mg via INTRAVENOUS

## 2019-06-20 MED ORDER — FENTANYL CITRATE (PF) 100 MCG/2ML IJ SOLN
INTRAMUSCULAR | Status: DC | PRN
  Administered 2019-06-20: 25 ug via INTRAVENOUS

## 2019-06-20 MED ORDER — HYDROMORPHONE HCL 1 MG/ML IJ SOLN
0.5000 mg | Freq: Once | INTRAMUSCULAR | Status: DC
  Filled 2019-06-20: qty 1

## 2019-06-20 MED ORDER — CEFAZOLIN SODIUM-DEXTROSE 2-4 GM/100ML-% IV SOLN
INTRAVENOUS | Status: AC
  Filled 2019-06-20: qty 100

## 2019-06-20 MED ORDER — ACETAMINOPHEN 325 MG PO TABS: 325.0000 mg | ORAL_TABLET | ORAL | Status: DC | PRN

## 2019-06-20 MED ORDER — FENTANYL CITRATE (PF) 100 MCG/2ML IJ SOLN: 25.0000 ug | INTRAMUSCULAR | Status: DC | PRN

## 2019-06-20 MED ORDER — MIDAZOLAM HCL 2 MG/2ML IJ SOLN
INTRAMUSCULAR | Status: AC
  Filled 2019-06-20: qty 2

## 2019-06-20 MED ORDER — SCOPOLAMINE 1 MG/3DAYS TD PT72
MEDICATED_PATCH | TRANSDERMAL | Status: DC | PRN
  Administered 2019-06-20: 1 via TRANSDERMAL

## 2019-06-20 MED ORDER — DEXAMETHASONE SODIUM PHOSPHATE 10 MG/ML IJ SOLN
INTRAMUSCULAR | Status: DC | PRN
  Administered 2019-06-20: 8 mg via INTRAVENOUS

## 2019-06-20 MED ORDER — FENTANYL CITRATE (PF) 100 MCG/2ML IJ SOLN
INTRAMUSCULAR | Status: AC
  Filled 2019-06-20: qty 2

## 2019-06-20 MED ORDER — PROPOFOL 10 MG/ML IV BOLUS
INTRAVENOUS | Status: DC | PRN
  Administered 2019-06-20: 200 mg via INTRAVENOUS

## 2019-06-20 MED ORDER — MIDAZOLAM HCL 5 MG/5ML IJ SOLN
INTRAMUSCULAR | Status: DC | PRN
  Administered 2019-06-20: 2 mg via INTRAVENOUS

## 2019-06-20 MED ORDER — SODIUM CHLORIDE 0.9 % IR SOLN
Status: DC | PRN
  Administered 2019-06-20: 3000 mL

## 2019-06-20 MED ORDER — OXYCODONE HCL 5 MG/5ML PO SOLN: 5.0000 mg | Freq: Once | ORAL | Status: DC | PRN

## 2019-06-20 MED ORDER — ONDANSETRON HCL 4 MG/2ML IJ SOLN
4.0000 mg | Freq: Once | INTRAMUSCULAR | Status: AC
  Administered 2019-06-20: 4 mg via INTRAVENOUS
  Filled 2019-06-20: qty 2

## 2019-06-20 MED ORDER — PHENYLEPHRINE 40 MCG/ML (10ML) SYRINGE FOR IV PUSH (FOR BLOOD PRESSURE SUPPORT)
PREFILLED_SYRINGE | INTRAVENOUS | Status: AC
  Filled 2019-06-20: qty 10

## 2019-06-20 MED ORDER — ACETAMINOPHEN 160 MG/5ML PO SOLN: 325.0000 mg | ORAL | Status: DC | PRN

## 2019-06-20 MED ORDER — IOHEXOL 300 MG/ML  SOLN
INTRAMUSCULAR | Status: DC | PRN
  Administered 2019-06-20: 50 mL

## 2019-06-20 MED ORDER — OXYCODONE HCL 5 MG PO TABS: 5.0000 mg | ORAL_TABLET | Freq: Once | ORAL | Status: DC | PRN

## 2019-06-20 MED ORDER — LACTATED RINGERS IV SOLN: INTRAVENOUS | Status: DC | PRN

## 2019-06-20 SURGICAL SUPPLY — 12 items
BAG URO CATCHER STRL LF (MISCELLANEOUS) ×2 IMPLANT
CATH URET 5FR 28IN OPEN ENDED (CATHETERS) ×2 IMPLANT
CLOTH BEACON ORANGE TIMEOUT ST (SAFETY) ×2 IMPLANT
GLOVE BIOGEL M STRL SZ7.5 (GLOVE) ×2 IMPLANT
GOWN STRL REUS W/TWL XL LVL3 (GOWN DISPOSABLE) ×2 IMPLANT
GUIDEWIRE STR DUAL SENSOR (WIRE) IMPLANT
GUIDEWIRE ZIPWRE .038 STRAIGHT (WIRE) ×2 IMPLANT
KIT TURNOVER KIT A (KITS) IMPLANT
MANIFOLD NEPTUNE II (INSTRUMENTS) ×2 IMPLANT
STENT URET 6FRX24 CONTOUR (STENTS) ×1 IMPLANT
TRAY CYSTO PACK (CUSTOM PROCEDURE TRAY) ×2 IMPLANT
TUBING CONNECTING 10 (TUBING) ×2 IMPLANT

## 2019-06-20 NOTE — Anesthesia Procedure Notes (Signed)
Procedure Name: LMA Insertion Date/Time: 06/20/2019 8:26 AM Performed by: Paris Lore, CRNA Pre-anesthesia Checklist: Patient identified, Emergency Drugs available, Suction available, Patient being monitored and Timeout performed Patient Re-evaluated:Patient Re-evaluated prior to induction Oxygen Delivery Method: Circle system utilized Preoxygenation: Pre-oxygenation with 100% oxygen Induction Type: IV induction Ventilation: Mask ventilation without difficulty LMA: LMA inserted LMA Size: 4.0 Number of attempts: 1 Placement Confirmation: positive ETCO2 and breath sounds checked- equal and bilateral Tube secured with: Tape

## 2019-06-20 NOTE — Anesthesia Preprocedure Evaluation (Addendum)
Anesthesia Evaluation  Patient identified by MRN, date of birth, ID band Patient awake    Reviewed: Allergy & Precautions, NPO status , Patient's Chart, lab work & pertinent test results  History of Anesthesia Complications (+) PONV and history of anesthetic complications  Airway Mallampati: II  TM Distance: >3 FB Neck ROM: Full    Dental  (+) Dental Advisory Given, Teeth Intact   Pulmonary asthma ,    Pulmonary exam normal breath sounds clear to auscultation       Cardiovascular negative cardio ROS Normal cardiovascular exam Rhythm:Regular Rate:Normal     Neuro/Psych  Headaches, PSYCHIATRIC DISORDERS Anxiety Depression    GI/Hepatic negative GI ROS, Neg liver ROS,   Endo/Other  negative endocrine ROS  Renal/GU Renal disease     Musculoskeletal negative musculoskeletal ROS (+)   Abdominal (+) + obese,   Peds  Hematology negative hematology ROS (+)   Anesthesia Other Findings   Reproductive/Obstetrics                             Anesthesia Physical  Anesthesia Plan  ASA: II and emergent  Anesthesia Plan: General   Post-op Pain Management:    Induction: Intravenous  PONV Risk Score and Plan: 4 or greater and Ondansetron, Dexamethasone, Midazolam and Treatment may vary due to age or medical condition  Airway Management Planned: LMA  Additional Equipment: None  Intra-op Plan:   Post-operative Plan: Extubation in OR  Informed Consent: I have reviewed the patients History and Physical, chart, labs and discussed the procedure including the risks, benefits and alternatives for the proposed anesthesia with the patient or authorized representative who has indicated his/her understanding and acceptance.     Dental advisory given  Plan Discussed with: CRNA and Anesthesiologist  Anesthesia Plan Comments: ( )       Anesthesia Quick Evaluation

## 2019-06-20 NOTE — H&P (Signed)
Urology Preoperative H&P   Chief Complaint: Right flank pain  History of Present Illness: Chris Proctor is a 29 y.o. male with a 1 week history of progressively worsening, constant, sharp, nonradiating right flank pain due to an obstructing 8 mm right proximal ureteral calculus that was diagnosed on CT from 06/19/2019.  He has a history of kidney stones and states that he is passed 2 stones previously.  He has never required surgery for kidney stones.  He denies fever/chills, dysuria or hematuria at home.  He does report mild nausea without emesis over the past 24 hours.    Past Medical History:  Diagnosis Date  . ADHD (attention deficit hyperactivity disorder)   . Anxiety   . Asthma   . Depression   . Headache    hx of migraines   . MRSA infection    hx of in 2006 and 2009   . PONV (postoperative nausea and vomiting)   . Ureterolithiasis     Past Surgical History:  Procedure Laterality Date  . INGUINAL HERNIA REPAIR Left 12/05/2014   Procedure: LEFT INGUINAL HERNIA REPAIR WITH  MESH;  Surgeon: Darnell Level, MD;  Location: [facility];  Service: General;  Laterality: Left;  . INSERTION OF MESH Left 12/05/2014   Procedure: INSERTION OF MESH;  Surgeon: Darnell Level, MD;  Location: [facility];  Service: General;  Laterality: Left;  . KNEE ARTHROSCOPY WITH LATERAL MENISECTOMY Right 07/29/2018   Procedure: RIGHT KNEE ARTHROSCOPY;  Surgeon: Bjorn Pippin, MD;  Location: [facility];  Service: Orthopedics;  Laterality: Right;  . MOUTH SURGERY      Allergies: No Known Allergies  Family History  Problem Relation Age of Onset  . Hyperlipidemia Mother     Social History:  reports that he has never smoked. He has never used smokeless tobacco. He reports current alcohol use of about 2.0 standard drinks of alcohol per week. He reports that he does not use drugs.  ROS: A complete review of systems was performed.  All systems are negative except for pertinent findings as  noted.  Physical Exam:  Vital signs in last 24 hours: Temp:  [98.2 F (36.8 C)] 98.2 F (36.8 C) (04/24 2245) Pulse Rate:  [73-89] 78 (04/25 0700) Resp:  [16-18] 16 (04/25 0506) BP: (138-182)/(63-99) 142/84 (04/25 0700) SpO2:  [95 %-100 %] 98 % (04/25 0700) Constitutional:  Alert and oriented, No acute distress Cardiovascular: Regular rate and rhythm, No JVD Respiratory: Normal respiratory effort, Lungs clear bilaterally GI: Abdomen is soft, nontender, nondistended, no abdominal masses GU: No CVA tenderness Lymphatic: No lymphadenopathy Neurologic: Grossly intact, no focal deficits Psychiatric: Normal mood and affect  Laboratory Data:  Recent Labs    06/20/19 0322  WBC 9.2  HGB 15.3  HCT 45.5  PLT 200    Recent Labs    06/20/19 0322  NA 138  K 4.2  CL 102  GLUCOSE 97  BUN 25*  CALCIUM 9.2  CREATININE 1.23     Results for orders placed or performed during the hospital encounter of 06/20/19 (from the past 24 hour(s))  CBC with Differential     Status: None   Collection Time: 06/20/19  3:22 AM  Result Value Ref Range   WBC 9.2 4.0 - 10.5 K/uL   RBC 5.24 4.22 - 5.81 MIL/uL   Hemoglobin 15.3 13.0 - 17.0 g/dL   HCT 93.2 67.1 - 24.5 %   MCV 86.8 80.0 - 100.0 fL   MCH 29.2 26.0 - 34.0  pg   MCHC 33.6 30.0 - 36.0 g/dL   RDW 13.6 11.5 - 15.5 %   Platelets 200 150 - 400 K/uL   nRBC 0.0 0.0 - 0.2 %   Neutrophils Relative % 63 %   Neutro Abs 5.8 1.7 - 7.7 K/uL   Lymphocytes Relative 24 %   Lymphs Abs 2.3 0.7 - 4.0 K/uL   Monocytes Relative 7 %   Monocytes Absolute 0.7 0.1 - 1.0 K/uL   Eosinophils Relative 4 %   Eosinophils Absolute 0.4 0.0 - 0.5 K/uL   Basophils Relative 1 %   Basophils Absolute 0.1 0.0 - 0.1 K/uL   Immature Granulocytes 1 %   Abs Immature Granulocytes 0.06 0.00 - 0.07 K/uL  Basic metabolic panel     Status: Abnormal   Collection Time: 06/20/19  3:22 AM  Result Value Ref Range   Sodium 138 135 - 145 mmol/L   Potassium 4.2 3.5 - 5.1 mmol/L    Chloride 102 98 - 111 mmol/L   CO2 26 22 - 32 mmol/L   Glucose, Bld 97 70 - 99 mg/dL   BUN 25 (H) 6 - 20 mg/dL   Creatinine, Ser 1.23 0.61 - 1.24 mg/dL   Calcium 9.2 8.9 - 10.3 mg/dL   GFR calc non Af Amer >60 >60 mL/min   GFR calc Af Amer >60 >60 mL/min   Anion gap 10 5 - 15  Urinalysis, Routine w reflex microscopic     Status: Abnormal   Collection Time: 06/20/19  3:24 AM  Result Value Ref Range   Color, Urine YELLOW YELLOW   APPearance CLEAR CLEAR   Specific Gravity, Urine 1.015 1.005 - 1.030   pH 6.0 5.0 - 8.0   Glucose, UA NEGATIVE NEGATIVE mg/dL   Hgb urine dipstick MODERATE (A) NEGATIVE   Bilirubin Urine NEGATIVE NEGATIVE   Ketones, ur NEGATIVE NEGATIVE mg/dL   Protein, ur NEGATIVE NEGATIVE mg/dL   Nitrite NEGATIVE NEGATIVE   Leukocytes,Ua NEGATIVE NEGATIVE   RBC / HPF 11-20 0 - 5 RBC/hpf   Bacteria, UA NONE SEEN NONE SEEN  Respiratory Panel by RT PCR (Flu A&B, Covid) - Nasopharyngeal Swab     Status: None   Collection Time: 06/20/19  4:28 AM   Specimen: Nasopharyngeal Swab  Result Value Ref Range   SARS Coronavirus 2 by RT PCR NEGATIVE NEGATIVE   Influenza A by PCR NEGATIVE NEGATIVE   Influenza B by PCR NEGATIVE NEGATIVE   Recent Results (from the past 240 hour(s))  Respiratory Panel by RT PCR (Flu A&B, Covid) - Nasopharyngeal Swab     Status: None   Collection Time: 06/20/19  4:28 AM   Specimen: Nasopharyngeal Swab  Result Value Ref Range Status   SARS Coronavirus 2 by RT PCR NEGATIVE NEGATIVE Final    Comment: (NOTE) SARS-CoV-2 target nucleic acids are NOT DETECTED. The SARS-CoV-2 RNA is generally detectable in upper respiratoy specimens during the acute phase of infection. The lowest concentration of SARS-CoV-2 viral copies this assay can detect is 131 copies/mL. A negative result does not preclude SARS-Cov-2 infection and should not be used as the sole basis for treatment or other patient management decisions. A negative result may occur with  improper  specimen collection/handling, submission of specimen other than nasopharyngeal swab, presence of viral mutation(s) within the areas targeted by this assay, and inadequate number of viral copies (<131 copies/mL). A negative result must be combined with clinical observations, patient history, and epidemiological information. The expected result is Negative. Fact  Sheet for Patients:  https://www.moore.com/ Fact Sheet for Healthcare Providers:  https://www.young.biz/ This test is not yet ap proved or cleared by the [place] FDA and  has been authorized for detection and/or diagnosis of SARS-CoV-2 by FDA under an Emergency Use Authorization (EUA). This EUA will remain  in effect (meaning this test can be used) for the duration of the COVID-19 declaration under Section 564(b)(1) of the Act, 21 U.S.C. section 360bbb-3(b)(1), unless the authorization is terminated or revoked sooner.    Influenza A by PCR NEGATIVE NEGATIVE Final   Influenza B by PCR NEGATIVE NEGATIVE Final    Comment: (NOTE) The Xpert Xpress SARS-CoV-2/FLU/RSV assay is intended as an aid in  the diagnosis of influenza from Nasopharyngeal swab specimens and  should not be used as a sole basis for treatment. Nasal washings and  aspirates are unacceptable for Xpert Xpress SARS-CoV-2/FLU/RSV  testing. Fact Sheet for Patients: https://www.moore.com/ Fact Sheet for Healthcare Providers: https://www.young.biz/ This test is not yet approved or cleared by the [place] FDA and  has been authorized for detection and/or diagnosis of SARS-CoV-2 by  FDA under an Emergency Use Authorization (EUA). This EUA will remain  in effect (meaning this test can be used) for the duration of the  Covid-19 declaration under Section 564(b)(1) of the Act, 21  U.S.C. section 360bbb-3(b)(1), unless the authorization is  terminated or revoked. Performed at [facility], [address], [place], [place] [address]     Renal Function: Recent Labs    06/20/19 0322  CREATININE 1.23   Estimated Creatinine Clearance: 114.3 mL/min (by C-G formula based on SCr of 1.23 mg/dL).  Radiologic Imaging: CT Renal Stone Study  Result Date: 06/19/2019 CLINICAL DATA:  29 year old male with history of right-sided flank pain for the past 2 days, worsening this morning. EXAM: CT ABDOMEN AND PELVIS WITHOUT CONTRAST TECHNIQUE: Multidetector CT imaging of the abdomen and pelvis was performed following the standard protocol without IV contrast. COMPARISON:  CT the abdomen and pelvis 09/03/2018. FINDINGS: Lower chest: Unremarkable. Hepatobiliary: Diffuse low attenuation throughout the hepatic parenchyma, indicative of hepatic steatosis. No definite suspicious cystic or solid hepatic lesions are confidently identified on today's noncontrast CT examination. Unenhanced appearance of the gallbladder is normal. Pancreas: No definite pancreatic mass or peripancreatic fluid collections or inflammatory changes are noted on today's noncontrast CT examination. Spleen: Unremarkable. Adrenals/Urinary Tract: 8 mm calculus in the proximal third of the right ureter with mild to moderate proximal right hydroureteronephrosis. No additional calculi are noted within the collecting system of either kidney, along the course of the left ureter or within the lumen of the urinary bladder. No left hydroureteronephrosis. Unenhanced appearance of the kidneys and bilateral adrenal glands is otherwise normal. Unenhanced appearance of the urinary bladder is normal. Stomach/Bowel: Unenhanced appearance of the stomach is normal. No pathologic dilatation of small bowel or colon. Normal appendix. Vascular/Lymphatic: No atherosclerotic calcifications in the abdominal aorta or pelvic vasculature. No lymphadenopathy noted in the abdomen or pelvis. Reproductive: Prostate gland and seminal vesicles are  unremarkable in appearance. Other: No significant volume of ascites.  No pneumoperitoneum. Musculoskeletal: There are no aggressive appearing lytic or blastic lesions noted in the visualized portions of the skeleton. IMPRESSION: 1. 8 mm obstructing calculus in the proximal third of the right ureter with mild to moderate proximal right hydroureteronephrosis. 2. Hepatic steatosis. Electronically Signed   By: Trudie Reed M.D.   On: 06/19/2019 07:34    I independently reviewed the above imaging studies.  Assessment  and Plan Chris Proctor is a 29 y.o. male with an 8 mm obstructing proximal right ureteral calculus with moderate hydronephrosis and acute kidney injury.  -The risks, benefits and alternatives of cystoscopy with RIGHT JJ stent placement was discussed with the patient.  Risks include, but are not limited to: bleeding, urinary tract infection, ureteral injury, ureteral stricture disease, chronic pain, urinary symptoms, bladder injury, stent migration, the need for nephrostomy tube placement, MI, CVA, DVT, PE and the inherent risks with general anesthesia.  The patient voices understanding and wishes to proceed.   Chris Moody, MD 06/20/2019, 7:26 AM  [facility] Pager: [phone]

## 2019-06-20 NOTE — ED Notes (Signed)
Pt sitting up in bed. Family at bedside. NAD noted. Will continue to monitor pt.

## 2019-06-20 NOTE — Op Note (Signed)
Operative Note  Preoperative diagnosis:  1.  8 mm right proximal ureteral calculus  Postoperative diagnosis: Same  Procedure(s): 1.  Cystoscopy with right JJ stent placement 2.  Right retrograde pyelogram with intraoperative interpretation of fluoroscopic imaging  Surgeon: Rhoderick Moody, MD  Assistants:  None  Anesthesia:  General  Complications:  None  EBL: Less than 5 mL  Specimens: 1.  None  Drains/Catheters: 1.  Right 6 French, 24 cm JJ stent without tether  Intraoperative findings:   1. Right retrograde pyelogram revealed a filling defect within the proximal aspects of the right ureter, consistent with his obstructing stone seen on recent CT.  The renal pelvis and its associated calyces were moderately dilated due to his obstructing stone with no other filling defects appreciated.  Indication:  Chris Proctor is a 29 y.o. male with an obstructing 8 mm right ureteral calculus with ongoing right renal colic and acute kidney injury.  He has been consented for the above procedures, voices understanding and wishes to proceed.  Description of procedure:  After informed consent was obtained, the patient was brought to the operating room and general LMA anesthesia was administered. The patient was then placed in the dorsolithotomy position and prepped and draped in the usual sterile fashion. A timeout was performed. A 23 French rigid cystoscope was then inserted into the urethral meatus and advanced into the bladder under direct vision. A complete bladder survey revealed no intravesical pathology.  A 5 French ureteral catheter was then inserted into the right ureteral orifice and a retrograde pyelogram was obtained, with the findings listed above.  A Glidewire was then used to intubate the lumen of the ureteral catheter and was advanced up to the right renal pelvis, under fluoroscopic guidance.  The catheter was then removed, leaving the wire in place.  A 6 French, 24 cm JJ  stent was then placed over the wire and into good position within the right collecting system, confirming placement via fluoroscopy.  The patient's bladder was drained.  He tolerated the procedure well and was transferred to the postanesthesia in stable condition.  Plan: Tentatively plan for ESWL in the next 1 to 2 weeks

## 2019-06-20 NOTE — Anesthesia Postprocedure Evaluation (Signed)
Anesthesia Post Note  Patient: Chris Proctor  Procedure(s) Performed: CYSTOSCOPY WITH STENT PLACEMENT retrograde pylegram (Right Ureter)     Patient location during evaluation: PACU Anesthesia Type: General Level of consciousness: awake and alert Pain management: pain level controlled Vital Signs Assessment: post-procedure vital signs reviewed and stable Respiratory status: spontaneous breathing, nonlabored ventilation, respiratory function stable and patient connected to nasal cannula oxygen Cardiovascular status: blood pressure returned to baseline and stable Postop Assessment: no apparent nausea or vomiting Anesthetic complications: no    Last Vitals:  Vitals:   06/20/19 0730 06/20/19 0904  BP: 134/78 133/84  Pulse: 75 82  Resp: 16 18  Temp:  37 C  SpO2: 98% 100%    Last Pain:  Vitals:   06/20/19 0904  TempSrc:   PainSc: 0-No pain                 Uthman Mroczkowski

## 2019-06-20 NOTE — ED Provider Notes (Signed)
[facility]-EMERGENCY DEPT Provider Note   CSN: [id number] Arrival date & time: 06/19/19  2211     History Chief Complaint  Patient presents with  . Flank Pain    Chris Proctor is a 29 y.o. male with a hx of ADHD, anxiety, depression, MRSA, kidney stones presents to the Emergency Department complaining of gradual, persistent, progressively worsening right-sided flank pain onset this morning after discharge.  Patient reports he was evaluated at [facility] last night for flank pain and diagnosed with an 8 mm kidney stone.  Reports pain was improved after Dilaudid and nausea medication and he felt well enough to go home.  Reports not long after returning home his pain increased.  He has been taking oral medications without relief.  He reports this afternoon the pain became unbearable.  Patient is followed at [facility] urology.  He contacted the group who recommended he present to the emergency department.  No aggravating or alleviating factors.  Patient reports this is some of the worst pain he is ever had.  He has never required surgical intervention for kidney stone in the past.  The history is provided by the patient and medical records. No language interpreter was used.       Past Medical History:  Diagnosis Date  . ADHD (attention deficit hyperactivity disorder)   . Anxiety   . Asthma   . Depression   . Headache    hx of migraines   . MRSA infection    hx of in 2006 and 2009   . PONV (postoperative nausea and vomiting)   . Ureterolithiasis     Patient Active Problem List   Diagnosis Date Noted  . Reducible left inguinal hernia 12/04/2014    Past Surgical History:  Procedure Laterality Date  . INGUINAL HERNIA REPAIR Left 12/05/2014   Procedure: LEFT INGUINAL HERNIA REPAIR WITH  MESH;  Surgeon: Darnell Level, MD;  Location: [facility];  Service: General;  Laterality: Left;  . INSERTION OF MESH Left 12/05/2014   Procedure: INSERTION OF MESH;  Surgeon: Darnell Level,  MD;  Location: [facility];  Service: General;  Laterality: Left;  . KNEE ARTHROSCOPY WITH LATERAL MENISECTOMY Right 07/29/2018   Procedure: RIGHT KNEE ARTHROSCOPY;  Surgeon: Bjorn Pippin, MD;  Location: [facility];  Service: Orthopedics;  Laterality: Right;  . MOUTH SURGERY         Family History  Problem Relation Age of Onset  . Hyperlipidemia Mother     Social History   Tobacco Use  . Smoking status: Never Smoker  . Smokeless tobacco: Never Used  Substance Use Topics  . Alcohol use: Yes    Alcohol/week: 2.0 standard drinks    Types: 2 Cans of beer per week    Comment: 1-2 x per week or beer on weekend  . Drug use: No    Home Medications Prior to Admission medications   Medication Sig Start Date End Date Taking? Authorizing Provider  acetaminophen (TYLENOL) 500 MG tablet Take 1,000 mg by mouth every 6 (six) hours as needed for mild pain, moderate pain or headache.   Yes [provider]  albuterol (PROVENTIL HFA;VENTOLIN HFA) 108 (90 BASE) MCG/ACT inhaler Inhale 1 puff into the lungs every 6 (six) hours as needed for wheezing or shortness of breath.   Yes [provider]  buPROPion (WELLBUTRIN XL) 150 MG 24 hr tablet Take 150 mg by mouth daily.  06/05/16  Yes [provider]  HYDROmorphone (DILAUDID) 2 MG tablet  Take 1 tablet (2 mg total) by mouth every 4 (four) hours as needed for severe pain. 06/19/19  Yes Molpus, John, MD  methylphenidate 36 MG PO CR tablet Take 36 mg by mouth daily after breakfast. 05/10/19  Yes [provider]  naproxen (NAPROSYN) 500 MG tablet Take 500 mg by mouth 2 (two) times daily as needed for mild pain or moderate pain.   Yes [provider]  ondansetron (ZOFRAN ODT) 8 MG disintegrating tablet Take 1 tablet (8 mg total) by mouth every 8 (eight) hours as needed for nausea or vomiting. 06/19/19  Yes Molpus, John, MD  sertraline (ZOLOFT) 100 MG tablet Take 150 mg by mouth daily.  11/02/14  Yes [provider]  tamsulosin (FLOMAX) 0.4 MG CAPS capsule Take 1 tablet daily until stone passes. Patient taking differently: Take 0.4 mg by mouth daily.  06/19/19  Yes Molpus, John, MD    Allergies    Patient has no known allergies.  Review of Systems   Review of Systems  Constitutional: Positive for diaphoresis. Negative for appetite change, fatigue, fever and unexpected weight change.  HENT: Negative for mouth sores.   Eyes: Negative for visual disturbance.  Respiratory: Negative for cough, chest tightness, shortness of breath and wheezing.   Cardiovascular: Negative for chest pain.  Gastrointestinal: Positive for nausea. Negative for abdominal pain, constipation, diarrhea and vomiting.  Endocrine: Negative for polydipsia, polyphagia and polyuria.  Genitourinary: Positive for flank pain. Negative for dysuria, frequency, hematuria and urgency.  Musculoskeletal: Negative for back pain and neck stiffness.  Skin: Negative for rash.  Allergic/Immunologic: Negative for immunocompromised state.  Neurological: Negative for syncope, light-headedness and headaches.  Hematological: Does not bruise/bleed easily.  Psychiatric/Behavioral: Negative for sleep disturbance. The patient is not nervous/anxious.     Physical Exam Updated Vital Signs BP (!) 153/94   Pulse 79   Temp 98.2 F (36.8 C) (Oral)   Resp 16   SpO2 98%   Physical Exam Vitals and nursing note reviewed.  Constitutional:      General: He is not in acute distress.    Appearance: He is ill-appearing and diaphoretic.  HENT:     Head: Normocephalic.  Eyes:     General: No scleral icterus.    Conjunctiva/sclera: Conjunctivae normal.  Cardiovascular:     Rate and Rhythm: Normal rate and regular rhythm.     Pulses: Normal pulses.          Radial pulses are 2+ on the right side and 2+ on the left side.  Pulmonary:     Effort: No tachypnea, accessory muscle usage, prolonged expiration, respiratory distress or retractions.      Breath sounds: No stridor.     Comments: Equal chest rise. No increased work of breathing. Abdominal:     General: There is no distension.     Palpations: Abdomen is soft.     Tenderness: There is no abdominal tenderness. There is no guarding or rebound.  Musculoskeletal:     Cervical back: Normal range of motion.     Comments: Moves all extremities equally and without difficulty.  Skin:    General: Skin is warm.     Capillary Refill: Capillary refill takes less than 2 seconds.     Coloration: Skin is pale.  Neurological:     Mental Status: He is alert.     GCS: GCS eye subscore is 4. GCS verbal subscore is 5. GCS motor subscore is 6.     Comments: Speech is  clear and goal oriented.  Psychiatric:        Mood and Affect: Mood normal.     ED Results / Procedures / Treatments   Labs (all labs ordered are listed, but only abnormal results are displayed) Labs Reviewed  BASIC METABOLIC PANEL - Abnormal; Notable for the following components:      Result Value   BUN 25 (*)    All other components within normal limits  URINALYSIS, ROUTINE W REFLEX MICROSCOPIC - Abnormal; Notable for the following components:   Hgb urine dipstick MODERATE (*)    All other components within normal limits  RESPIRATORY PANEL BY RT PCR (FLU A&B, COVID)  CBC WITH DIFFERENTIAL/PLATELET     Radiology CT Renal Stone Study  Result Date: 06/19/2019 CLINICAL DATA:  29 year old male with history of right-sided flank pain for the past 2 days, worsening this morning. EXAM: CT ABDOMEN AND PELVIS WITHOUT CONTRAST TECHNIQUE: Multidetector CT imaging of the abdomen and pelvis was performed following the standard protocol without IV contrast. COMPARISON:  CT the abdomen and pelvis 09/03/2018. FINDINGS: Lower chest: Unremarkable. Hepatobiliary: Diffuse low attenuation throughout the hepatic parenchyma, indicative of hepatic steatosis. No definite suspicious cystic or solid hepatic lesions are confidently identified on  today's noncontrast CT examination. Unenhanced appearance of the gallbladder is normal. Pancreas: No definite pancreatic mass or peripancreatic fluid collections or inflammatory changes are noted on today's noncontrast CT examination. Spleen: Unremarkable. Adrenals/Urinary Tract: 8 mm calculus in the proximal third of the right ureter with mild to moderate proximal right hydroureteronephrosis. No additional calculi are noted within the collecting system of either kidney, along the course of the left ureter or within the lumen of the urinary bladder. No left hydroureteronephrosis. Unenhanced appearance of the kidneys and bilateral adrenal glands is otherwise normal. Unenhanced appearance of the urinary bladder is normal. Stomach/Bowel: Unenhanced appearance of the stomach is normal. No pathologic dilatation of small bowel or colon. Normal appendix. Vascular/Lymphatic: No atherosclerotic calcifications in the abdominal aorta or pelvic vasculature. No lymphadenopathy noted in the abdomen or pelvis. Reproductive: Prostate gland and seminal vesicles are unremarkable in appearance. Other: No significant volume of ascites.  No pneumoperitoneum. Musculoskeletal: There are no aggressive appearing lytic or blastic lesions noted in the visualized portions of the skeleton. IMPRESSION: 1. 8 mm obstructing calculus in the proximal third of the right ureter with mild to moderate proximal right hydroureteronephrosis. 2. Hepatic steatosis. Electronically Signed   By: Trudie Reed M.D.   On: 06/19/2019 07:34    Procedures Procedures (including critical care time)  Medications Ordered in ED Medications  HYDROmorphone (DILAUDID) injection 0.5 mg (has no administration in time range)  HYDROmorphone (DILAUDID) injection 1 mg (1 mg Intravenous Given 06/20/19 0333)  ondansetron (ZOFRAN) injection 4 mg (4 mg Intravenous Given 06/20/19 8841)    ED Course  I have reviewed the triage vital signs and the nursing  notes.  Pertinent labs & imaging results that were available during my care of the patient were reviewed by me and considered in my medical decision making (see chart for details).  Clinical Course as of Jun 20 551  Sun Jun 20, 2019  0321 Last oral intake - 6pm   [HM]  0400 Patient's pain improved.  No to 3/10 down from a 9/10.  Additional pain medication ordered.  Patient's diaphoresis has improved as well.   [HM]  0428 Elevated from yesterday when it was at 0.95.  Creatinine: 1.23 [HM]  0428 No evidence of urinary tract infection  Ketones,  ur: NEGATIVE [HM]  0428 No leukocytosis.  WBC: 9.2 [HM]  0442 Discussed with Dr. Lovena Neighbours who offers admission for stent today vs attempt to go home and make an appointment on Monday to schedule surgery.  Will discuss options with patient.   [HM]    Clinical Course User Index [HM] Honi Name, Gwenlyn Perking   MDM Rules/Calculators/A&P                      Patient presents with worsening of his flank pain after being diagnosed with 8 mm kidney stone yesterday.  I personally evaluated the record and CT scan from yesterday which confirms patient's stone.  He is ill-appearing and very uncomfortable in bed.  Medications ordered.  Will consult with urology as I suspect he will need surgical intervention.  5:50 AM Patient pain has improved significantly.  He is not vomiting.  Last oral intake was 6 PM.  Discussed with Dr. Lovena Neighbours and patient.  Patient does wish for stent placement this morning.  Dr. Lovena Neighbours will admit and post for placement this AM.  Pt is to remain NPO.   Final Clinical Impression(s) / ED Diagnoses Final diagnoses:  Right flank pain  Ureteral colic    Rx / DC Orders ED Discharge Orders    None       Alycea Segoviano, Gwenlyn Perking 06/20/19 0554    Molpus, Jenny Reichmann, MD 06/20/19 0556    Molpus, Jenny Reichmann, MD 06/20/19 [phone]

## 2019-06-20 NOTE — ED Notes (Signed)
Pt lying in bed, eye's closed, chest rising and falling. Pt awakens easy. Denies any needs. Will continue to monitor.

## 2019-06-20 NOTE — ED Notes (Signed)
Pt sitting up in bed. NAD noted. PA at bedside with update. Will continue to monitor.

## 2019-06-20 NOTE — Transfer of Care (Signed)
Immediate Anesthesia Transfer of Care Note  Patient: Chris Proctor  Procedure(s) Performed: Procedure(s): CYSTOSCOPY WITH STENT PLACEMENT retrograde pylegram (Right)  Patient Location: PACU  Anesthesia Type:General  Level of Consciousness:  sedated, patient cooperative and responds to stimulation  Airway & Oxygen Therapy:Patient Spontanous Breathing and Patient connected to face mask oxgen  Post-op Assessment:  Report given to PACU RN and Post -op Vital signs reviewed and stable  Post vital signs:  Reviewed and stable  Last Vitals:  Vitals:   06/20/19 0730 06/20/19 0904  BP: 134/78 133/84  Pulse: 75 82  Resp: 16   Temp:    SpO2: 98% 100%    Complications: No apparent anesthesia complications

## 2019-06-20 NOTE — Progress Notes (Signed)
1030AM Patient called to state his penis is "dripping Blood" and wants to speak with MD. Dr. Liliane Shi notified and states he will call patient, states this is to be expected. Thsi was explained to patient when giving dischrge instructions as well as highlighted in Discharge papers

## 2019-06-21 ENCOUNTER — Other Ambulatory Visit: Payer: Self-pay | Admitting: Urology

## 2019-06-21 ENCOUNTER — Other Ambulatory Visit (HOSPITAL_COMMUNITY)
Admission: RE | Admit: 2019-06-21 | Discharge: 2019-06-21 | Disposition: A | Discharge status: Home / Self Care | Payer: BC Managed Care – PPO | Source: Ambulatory Visit | Attending: Urology | Admitting: Urology

## 2019-06-21 DIAGNOSIS — Z20822 Contact with and (suspected) exposure to covid-19: Secondary | ICD-10-CM | POA: Insufficient documentation

## 2019-06-21 DIAGNOSIS — N200 Calculus of kidney: Secondary | ICD-10-CM

## 2019-06-21 DIAGNOSIS — Z01812 Encounter for preprocedural laboratory examination: Secondary | ICD-10-CM | POA: Insufficient documentation

## 2019-06-21 LAB — SARS CORONAVIRUS 2 (TAT 6-24 HRS): SARS Coronavirus 2: NEGATIVE

## 2019-06-24 ENCOUNTER — Encounter (HOSPITAL_BASED_OUTPATIENT_CLINIC_OR_DEPARTMENT_OTHER): Payer: Self-pay | Admitting: Urology

## 2019-06-24 ENCOUNTER — Encounter (HOSPITAL_BASED_OUTPATIENT_CLINIC_OR_DEPARTMENT_OTHER)
Admission: RE | Disposition: A | Discharge status: Home / Self Care | Payer: Self-pay | Source: Home / Self Care | Attending: Urology

## 2019-06-24 ENCOUNTER — Other Ambulatory Visit: Payer: Self-pay

## 2019-06-24 ENCOUNTER — Ambulatory Visit (HOSPITAL_BASED_OUTPATIENT_CLINIC_OR_DEPARTMENT_OTHER)
Admission: RE | Admit: 2019-06-24 | Discharge: 2019-06-24 | Disposition: A | Discharge status: Home / Self Care | Payer: BC Managed Care – PPO | Attending: Urology | Admitting: Urology

## 2019-06-24 ENCOUNTER — Ambulatory Visit (HOSPITAL_COMMUNITY): Payer: BC Managed Care – PPO

## 2019-06-24 DIAGNOSIS — Z6834 Body mass index (BMI) 34.0-34.9, adult: Secondary | ICD-10-CM | POA: Insufficient documentation

## 2019-06-24 DIAGNOSIS — Z79899 Other long term (current) drug therapy: Secondary | ICD-10-CM | POA: Diagnosis not present

## 2019-06-24 DIAGNOSIS — E669 Obesity, unspecified: Secondary | ICD-10-CM | POA: Diagnosis not present

## 2019-06-24 DIAGNOSIS — Z01818 Encounter for other preprocedural examination: Secondary | ICD-10-CM | POA: Diagnosis not present

## 2019-06-24 DIAGNOSIS — N200 Calculus of kidney: Secondary | ICD-10-CM | POA: Diagnosis not present

## 2019-06-24 HISTORY — PX: EXTRACORPOREAL SHOCK WAVE LITHOTRIPSY: SHX1557

## 2019-06-24 SURGERY — LITHOTRIPSY, ESWL
Anesthesia: LOCAL | Laterality: Right

## 2019-06-24 MED ORDER — DIAZEPAM 5 MG PO TABS
ORAL_TABLET | ORAL | Status: AC
  Filled 2019-06-24: qty 2

## 2019-06-24 MED ORDER — CIPROFLOXACIN HCL 500 MG PO TABS
ORAL_TABLET | ORAL | Status: AC
  Filled 2019-06-24: qty 1

## 2019-06-24 MED ORDER — DIPHENHYDRAMINE HCL 25 MG PO CAPS
25.0000 mg | ORAL_CAPSULE | ORAL | Status: AC
  Administered 2019-06-24: 25 mg via ORAL

## 2019-06-24 MED ORDER — ONDANSETRON 8 MG PO TBDP: 8.0000 mg | ORAL_TABLET | Freq: Three times a day (TID) | ORAL | 1 refills | Status: DC | PRN

## 2019-06-24 MED ORDER — DIAZEPAM 5 MG PO TABS
10.0000 mg | ORAL_TABLET | ORAL | Status: AC
  Administered 2019-06-24: 10 mg via ORAL

## 2019-06-24 MED ORDER — HYDROMORPHONE HCL 2 MG PO TABS: 2.0000 mg | ORAL_TABLET | ORAL | 0 refills | Status: DC | PRN

## 2019-06-24 MED ORDER — TAMSULOSIN HCL 0.4 MG PO CAPS: 0.4000 mg | ORAL_CAPSULE | Freq: Every day | ORAL | 0 refills | Status: DC

## 2019-06-24 MED ORDER — DIPHENHYDRAMINE HCL 25 MG PO CAPS
ORAL_CAPSULE | ORAL | Status: AC
  Filled 2019-06-24: qty 1

## 2019-06-24 MED ORDER — SODIUM CHLORIDE 0.9 % IV SOLN: INTRAVENOUS | Status: DC

## 2019-06-24 MED ORDER — CIPROFLOXACIN HCL 500 MG PO TABS
500.0000 mg | ORAL_TABLET | ORAL | Status: AC
  Administered 2019-06-24: 500 mg via ORAL

## 2019-06-24 NOTE — Discharge Instructions (Signed)
Post Anesthesia Home Care Instructions  Activity: Get plenty of rest for the remainder of the day. A responsible adult should stay with you for 24 hours following the procedure.  For the next 24 hours, DO NOT: -Drive a car -Operate machinery -Drink alcoholic beverages -Take any medication unless instructed by your physician -Make any legal decisions or sign important papers.  Meals: Start with liquid foods such as gelatin or soup. Progress to regular foods as tolerated. Avoid greasy, spicy, heavy foods. If nausea and/or vomiting occur, drink only clear liquids until the nausea and/or vomiting subsides. Call your physician if vomiting continues.  Special Instructions/Symptoms: Your throat may feel dry or sore from the anesthesia or the breathing tube placed in your throat during surgery. If this causes discomfort, gargle with warm salt water. The discomfort should disappear within 24 hours.  If you had a scopolamine patch placed behind your ear for the management of post- operative nausea and/or vomiting:  1. The medication in the patch is effective for 72 hours, after which it should be removed.  Wrap patch in a tissue and discard in the trash. Wash hands thoroughly with soap and water. 2. You may remove the patch earlier than 72 hours if you experience unpleasant side effects which may include dry mouth, dizziness or visual disturbances. 3. Avoid touching the patch. Wash your hands with soap and water after contact with the patch.   Lithotripsy, Care After This sheet gives you information about how to care for yourself after your procedure. Your health care provider may also give you more specific instructions. If you have problems or questions, contact your health care provider. What can I expect after the procedure? After the procedure, it is common to have:  Some blood in your urine. This should only last for a few days.  Soreness in your back, sides, or upper abdomen for a few  days.  Blotches or bruises on your back where the pressure wave entered the skin.  Pain, discomfort, or nausea when pieces (fragments) of the kidney stone move through the tube that carries urine from the kidney to the bladder (ureter). Stone fragments may pass soon after the procedure, but they may continue to pass for up to 4-8 weeks. ? If you have severe pain or nausea, contact your health care provider. This may be caused by a large stone that was not broken up, and this may mean that you need more treatment.  Some pain or discomfort during urination.  Some pain or discomfort in the lower abdomen or (in men) at the base of the penis. Follow these instructions at home: Medicines  Take over-the-counter and prescription medicines only as told by your health care provider.  If you were prescribed an antibiotic medicine, take it as told by your health care provider. Do not stop taking the antibiotic even if you start to feel better.  Do not drive for 24 hours if you were given a medicine to help you relax (sedative).  Do not drive or use heavy machinery while taking prescription pain medicine. Eating and drinking      Drink enough water and fluids to keep your urine clear or pale yellow. This helps any remaining pieces of the stone to pass. It can also help prevent new stones from forming.  Eat plenty of fresh fruits and vegetables.  Follow instructions from your health care provider about eating and drinking restrictions. You may be instructed: ? To reduce how much salt (sodium) you eat   or drink. Check ingredients and nutrition facts on packaged foods and beverages. ? To reduce how much meat you eat.  Eat the recommended amount of calcium for your age and gender. Ask your health care provider how much calcium you should have. General instructions  Get plenty of rest.  Most people can resume normal activities 1-2 days after the procedure. Ask your health care provider what  activities are safe for you.  Your health care provider may direct you to lie in a certain position (postural drainage) and tap firmly (percuss) over your kidney area to help stone fragments pass. Follow instructions as told by your health care provider.  If directed, strain all urine through the strainer that was provided by your health care provider. ? Keep all fragments for your health care provider to see. Any stones that are found may be sent to a medical lab for examination. The stone may be as small as a grain of salt.  Keep all follow-up visits as told by your health care provider. This is important. Contact a health care provider if:  You have pain that is severe or does not get better with medicine.  You have nausea that is severe or does not go away.  You have blood in your urine longer than your health care provider told you to expect.  You have more blood in your urine.  You have pain during urination that does not go away.  You urinate more frequently than usual and this does not go away.  You develop a rash or any other possible signs of an allergic reaction. Get help right away if:  You have severe pain in your back, sides, or upper abdomen.  You have severe pain while urinating.  Your urine is very dark red.  You have blood in your stool (feces).  You cannot pass any urine at all.  You feel a strong urge to urinate after emptying your bladder.  You have a fever or chills.  You develop shortness of breath, difficulty breathing, or chest pain.  You have severe nausea that leads to persistent vomiting.  You faint. Summary  After this procedure, it is common to have some pain, discomfort, or nausea when pieces (fragments) of the kidney stone move through the tube that carries urine from the kidney to the bladder (ureter). If this pain or nausea is severe, however, you should contact your health care provider.  Most people can resume normal activities 1-2  days after the procedure. Ask your health care provider what activities are safe for you.  Drink enough water and fluids to keep your urine clear or pale yellow. This helps any remaining pieces of the stone to pass, and it can help prevent new stones from forming.  If directed, strain your urine and keep all fragments for your health care provider to see. Fragments or stones may be as small as a grain of salt.  Get help right away if you have severe pain in your back, sides, or upper abdomen or have severe pain while urinating. This information is not intended to replace advice given to you by your health care provider. Make sure you discuss any questions you have with your health care provider. Document Revised: 05/25/2018 Document Reviewed: 01/03/2016 [employer] Patient Education  2020 [employer]  

## 2019-06-29 DIAGNOSIS — N201 Calculus of ureter: Secondary | ICD-10-CM | POA: Diagnosis not present

## 2019-07-07 DIAGNOSIS — M9903 Segmental and somatic dysfunction of lumbar region: Secondary | ICD-10-CM | POA: Diagnosis not present

## 2019-07-07 DIAGNOSIS — M9901 Segmental and somatic dysfunction of cervical region: Secondary | ICD-10-CM | POA: Diagnosis not present

## 2019-07-07 DIAGNOSIS — M530 Cervicocranial syndrome: Secondary | ICD-10-CM | POA: Diagnosis not present

## 2019-07-07 DIAGNOSIS — M9902 Segmental and somatic dysfunction of thoracic region: Secondary | ICD-10-CM | POA: Diagnosis not present

## 2019-07-12 DIAGNOSIS — M9901 Segmental and somatic dysfunction of cervical region: Secondary | ICD-10-CM | POA: Diagnosis not present

## 2019-07-12 DIAGNOSIS — M530 Cervicocranial syndrome: Secondary | ICD-10-CM | POA: Diagnosis not present

## 2019-07-12 DIAGNOSIS — M9903 Segmental and somatic dysfunction of lumbar region: Secondary | ICD-10-CM | POA: Diagnosis not present

## 2019-07-12 DIAGNOSIS — M9902 Segmental and somatic dysfunction of thoracic region: Secondary | ICD-10-CM | POA: Diagnosis not present

## 2019-07-13 DIAGNOSIS — M9903 Segmental and somatic dysfunction of lumbar region: Secondary | ICD-10-CM | POA: Diagnosis not present

## 2019-07-13 DIAGNOSIS — M9901 Segmental and somatic dysfunction of cervical region: Secondary | ICD-10-CM | POA: Diagnosis not present

## 2019-07-13 DIAGNOSIS — M530 Cervicocranial syndrome: Secondary | ICD-10-CM | POA: Diagnosis not present

## 2019-07-13 DIAGNOSIS — M9902 Segmental and somatic dysfunction of thoracic region: Secondary | ICD-10-CM | POA: Diagnosis not present

## 2019-07-20 DIAGNOSIS — M9902 Segmental and somatic dysfunction of thoracic region: Secondary | ICD-10-CM | POA: Diagnosis not present

## 2019-07-20 DIAGNOSIS — M530 Cervicocranial syndrome: Secondary | ICD-10-CM | POA: Diagnosis not present

## 2019-07-20 DIAGNOSIS — M9903 Segmental and somatic dysfunction of lumbar region: Secondary | ICD-10-CM | POA: Diagnosis not present

## 2019-07-20 DIAGNOSIS — M9901 Segmental and somatic dysfunction of cervical region: Secondary | ICD-10-CM | POA: Diagnosis not present

## 2019-07-21 DIAGNOSIS — M9903 Segmental and somatic dysfunction of lumbar region: Secondary | ICD-10-CM | POA: Diagnosis not present

## 2019-07-21 DIAGNOSIS — M9902 Segmental and somatic dysfunction of thoracic region: Secondary | ICD-10-CM | POA: Diagnosis not present

## 2019-07-21 DIAGNOSIS — M530 Cervicocranial syndrome: Secondary | ICD-10-CM | POA: Diagnosis not present

## 2019-07-21 DIAGNOSIS — M9901 Segmental and somatic dysfunction of cervical region: Secondary | ICD-10-CM | POA: Diagnosis not present

## 2019-07-22 DIAGNOSIS — N132 Hydronephrosis with renal and ureteral calculous obstruction: Secondary | ICD-10-CM | POA: Diagnosis not present

## 2019-07-22 DIAGNOSIS — N202 Calculus of kidney with calculus of ureter: Secondary | ICD-10-CM | POA: Diagnosis not present

## 2019-07-22 DIAGNOSIS — N201 Calculus of ureter: Secondary | ICD-10-CM | POA: Diagnosis not present

## 2019-07-28 DIAGNOSIS — M9902 Segmental and somatic dysfunction of thoracic region: Secondary | ICD-10-CM | POA: Diagnosis not present

## 2019-07-28 DIAGNOSIS — M9901 Segmental and somatic dysfunction of cervical region: Secondary | ICD-10-CM | POA: Diagnosis not present

## 2019-07-28 DIAGNOSIS — M9903 Segmental and somatic dysfunction of lumbar region: Secondary | ICD-10-CM | POA: Diagnosis not present

## 2019-07-28 DIAGNOSIS — M530 Cervicocranial syndrome: Secondary | ICD-10-CM | POA: Diagnosis not present

## 2019-08-09 DIAGNOSIS — M9901 Segmental and somatic dysfunction of cervical region: Secondary | ICD-10-CM | POA: Diagnosis not present

## 2019-08-09 DIAGNOSIS — M530 Cervicocranial syndrome: Secondary | ICD-10-CM | POA: Diagnosis not present

## 2019-08-09 DIAGNOSIS — M9903 Segmental and somatic dysfunction of lumbar region: Secondary | ICD-10-CM | POA: Diagnosis not present

## 2019-08-09 DIAGNOSIS — M9902 Segmental and somatic dysfunction of thoracic region: Secondary | ICD-10-CM | POA: Diagnosis not present

## 2019-08-16 DIAGNOSIS — M9901 Segmental and somatic dysfunction of cervical region: Secondary | ICD-10-CM | POA: Diagnosis not present

## 2019-08-16 DIAGNOSIS — M9902 Segmental and somatic dysfunction of thoracic region: Secondary | ICD-10-CM | POA: Diagnosis not present

## 2019-08-16 DIAGNOSIS — M9903 Segmental and somatic dysfunction of lumbar region: Secondary | ICD-10-CM | POA: Diagnosis not present

## 2019-08-16 DIAGNOSIS — M530 Cervicocranial syndrome: Secondary | ICD-10-CM | POA: Diagnosis not present

## 2019-08-18 DIAGNOSIS — M530 Cervicocranial syndrome: Secondary | ICD-10-CM | POA: Diagnosis not present

## 2019-08-18 DIAGNOSIS — M9901 Segmental and somatic dysfunction of cervical region: Secondary | ICD-10-CM | POA: Diagnosis not present

## 2019-08-18 DIAGNOSIS — M9902 Segmental and somatic dysfunction of thoracic region: Secondary | ICD-10-CM | POA: Diagnosis not present

## 2019-08-18 DIAGNOSIS — M9903 Segmental and somatic dysfunction of lumbar region: Secondary | ICD-10-CM | POA: Diagnosis not present

## 2019-08-23 DIAGNOSIS — M530 Cervicocranial syndrome: Secondary | ICD-10-CM | POA: Diagnosis not present

## 2019-08-23 DIAGNOSIS — M9902 Segmental and somatic dysfunction of thoracic region: Secondary | ICD-10-CM | POA: Diagnosis not present

## 2019-08-23 DIAGNOSIS — M9901 Segmental and somatic dysfunction of cervical region: Secondary | ICD-10-CM | POA: Diagnosis not present

## 2019-08-23 DIAGNOSIS — M9903 Segmental and somatic dysfunction of lumbar region: Secondary | ICD-10-CM | POA: Diagnosis not present

## 2019-08-25 DIAGNOSIS — M9903 Segmental and somatic dysfunction of lumbar region: Secondary | ICD-10-CM | POA: Diagnosis not present

## 2019-08-25 DIAGNOSIS — M9901 Segmental and somatic dysfunction of cervical region: Secondary | ICD-10-CM | POA: Diagnosis not present

## 2019-08-25 DIAGNOSIS — M530 Cervicocranial syndrome: Secondary | ICD-10-CM | POA: Diagnosis not present

## 2019-08-25 DIAGNOSIS — M9902 Segmental and somatic dysfunction of thoracic region: Secondary | ICD-10-CM | POA: Diagnosis not present

## 2019-09-06 DIAGNOSIS — M9901 Segmental and somatic dysfunction of cervical region: Secondary | ICD-10-CM | POA: Diagnosis not present

## 2019-09-06 DIAGNOSIS — M9903 Segmental and somatic dysfunction of lumbar region: Secondary | ICD-10-CM | POA: Diagnosis not present

## 2019-09-06 DIAGNOSIS — M9902 Segmental and somatic dysfunction of thoracic region: Secondary | ICD-10-CM | POA: Diagnosis not present

## 2019-09-06 DIAGNOSIS — M530 Cervicocranial syndrome: Secondary | ICD-10-CM | POA: Diagnosis not present

## 2019-09-08 DIAGNOSIS — M9902 Segmental and somatic dysfunction of thoracic region: Secondary | ICD-10-CM | POA: Diagnosis not present

## 2019-09-08 DIAGNOSIS — M9901 Segmental and somatic dysfunction of cervical region: Secondary | ICD-10-CM | POA: Diagnosis not present

## 2019-09-08 DIAGNOSIS — M530 Cervicocranial syndrome: Secondary | ICD-10-CM | POA: Diagnosis not present

## 2019-09-08 DIAGNOSIS — M9903 Segmental and somatic dysfunction of lumbar region: Secondary | ICD-10-CM | POA: Diagnosis not present

## 2019-09-09 DIAGNOSIS — F900 Attention-deficit hyperactivity disorder, predominantly inattentive type: Secondary | ICD-10-CM | POA: Diagnosis not present

## 2019-09-29 DIAGNOSIS — Z20822 Contact with and (suspected) exposure to covid-19: Secondary | ICD-10-CM | POA: Diagnosis not present

## 2019-11-09 DIAGNOSIS — S6990XA Unspecified injury of unspecified wrist, hand and finger(s), initial encounter: Secondary | ICD-10-CM | POA: Diagnosis not present

## 2019-11-09 DIAGNOSIS — Z6835 Body mass index (BMI) 35.0-35.9, adult: Secondary | ICD-10-CM | POA: Diagnosis not present

## 2020-01-17 DIAGNOSIS — N200 Calculus of kidney: Secondary | ICD-10-CM | POA: Diagnosis not present

## 2020-01-26 ENCOUNTER — Encounter (HOSPITAL_COMMUNITY): Payer: Self-pay

## 2020-01-26 ENCOUNTER — Emergency Department (HOSPITAL_COMMUNITY)
Admission: EM | Admit: 2020-01-26 | Discharge: 2020-01-26 | Disposition: A | Discharge status: Home / Self Care | Payer: BC Managed Care – PPO | Attending: Emergency Medicine | Admitting: Emergency Medicine

## 2020-01-26 ENCOUNTER — Other Ambulatory Visit: Payer: Self-pay

## 2020-01-26 ENCOUNTER — Emergency Department (HOSPITAL_COMMUNITY): Payer: BC Managed Care – PPO

## 2020-01-26 DIAGNOSIS — R0602 Shortness of breath: Secondary | ICD-10-CM | POA: Diagnosis not present

## 2020-01-26 DIAGNOSIS — Z20822 Contact with and (suspected) exposure to covid-19: Secondary | ICD-10-CM | POA: Diagnosis not present

## 2020-01-26 DIAGNOSIS — Z7951 Long term (current) use of inhaled steroids: Secondary | ICD-10-CM | POA: Insufficient documentation

## 2020-01-26 DIAGNOSIS — R062 Wheezing: Secondary | ICD-10-CM | POA: Diagnosis not present

## 2020-01-26 DIAGNOSIS — J111 Influenza due to unidentified influenza virus with other respiratory manifestations: Secondary | ICD-10-CM

## 2020-01-26 DIAGNOSIS — J45909 Unspecified asthma, uncomplicated: Secondary | ICD-10-CM | POA: Insufficient documentation

## 2020-01-26 DIAGNOSIS — J101 Influenza due to other identified influenza virus with other respiratory manifestations: Secondary | ICD-10-CM | POA: Diagnosis not present

## 2020-01-26 DIAGNOSIS — R000 Tachycardia, unspecified: Secondary | ICD-10-CM | POA: Diagnosis not present

## 2020-01-26 DIAGNOSIS — R0689 Other abnormalities of breathing: Secondary | ICD-10-CM | POA: Diagnosis not present

## 2020-01-26 DIAGNOSIS — R059 Cough, unspecified: Secondary | ICD-10-CM | POA: Diagnosis not present

## 2020-01-26 DIAGNOSIS — R0902 Hypoxemia: Secondary | ICD-10-CM | POA: Diagnosis not present

## 2020-01-26 DIAGNOSIS — J80 Acute respiratory distress syndrome: Secondary | ICD-10-CM | POA: Diagnosis not present

## 2020-01-26 LAB — COMPREHENSIVE METABOLIC PANEL
ALT: 54 U/L — ABNORMAL HIGH (ref 0–44)
AST: 36 U/L (ref 15–41)
Albumin: 4.6 g/dL (ref 3.5–5.0)
Alkaline Phosphatase: 72 U/L (ref 38–126)
Anion gap: 11 (ref 5–15)
BUN: 11 mg/dL (ref 6–20)
CO2: 25 mmol/L (ref 22–32)
Calcium: 8.9 mg/dL (ref 8.9–10.3)
Chloride: 101 mmol/L (ref 98–111)
Creatinine, Ser: 0.78 mg/dL (ref 0.61–1.24)
GFR, Estimated: >60 mL/min (ref >60)
Glucose, Bld: 129 mg/dL — ABNORMAL HIGH (ref 70–99)
Potassium: 3.9 mmol/L (ref 3.5–5.1)
Sodium: 137 mmol/L (ref 135–145)
Total Bilirubin: 0.5 mg/dL (ref 0.3–1.2)
Total Protein: 7.5 g/dL (ref 6.5–8.1)

## 2020-01-26 LAB — RESP PANEL BY RT-PCR (FLU A&B, COVID) ARPGX2
Influenza A by PCR: POSITIVE — AB
Influenza B by PCR: NEGATIVE
SARS Coronavirus 2 by RT PCR: NEGATIVE

## 2020-01-26 LAB — APTT: aPTT: 34 seconds (ref 24–36)

## 2020-01-26 LAB — CBC WITH DIFFERENTIAL/PLATELET
Abs Immature Granulocytes: 0.03 10*3/uL (ref 0.00–0.07)
Basophils Absolute: 0 10*3/uL (ref 0.0–0.1)
Basophils Relative: 0 %
Eosinophils Absolute: 0 10*3/uL (ref 0.0–0.5)
Eosinophils Relative: 0 %
HCT: 43.6 % (ref 39.0–52.0)
Hemoglobin: 14.7 g/dL (ref 13.0–17.0)
Immature Granulocytes: 1 %
Lymphocytes Relative: 10 %
Lymphs Abs: 0.6 10*3/uL — ABNORMAL LOW (ref 0.7–4.0)
MCH: 29.3 pg (ref 26.0–34.0)
MCHC: 33.7 g/dL (ref 30.0–36.0)
MCV: 87 fL (ref 80.0–100.0)
Monocytes Absolute: 0.6 10*3/uL (ref 0.1–1.0)
Monocytes Relative: 10 %
Neutro Abs: 5 10*3/uL (ref 1.7–7.7)
Neutrophils Relative %: 79 %
Platelets: 142 10*3/uL — ABNORMAL LOW (ref 150–400)
RBC: 5.01 MIL/uL (ref 4.22–5.81)
RDW: 13.5 % (ref 11.5–15.5)
WBC: 6.2 10*3/uL (ref 4.0–10.5)
nRBC: 0 % (ref 0.0–0.2)

## 2020-01-26 LAB — LACTIC ACID, PLASMA: Lactic Acid, Venous: 1.8 mmol/L (ref 0.5–1.9)

## 2020-01-26 LAB — PROTIME-INR
INR: 1.1 (ref 0.8–1.2)
Prothrombin Time: 14 seconds (ref 11.4–15.2)

## 2020-01-26 MED ORDER — SODIUM CHLORIDE 0.9 % IV BOLUS
1000.0000 mL | Freq: Once | INTRAVENOUS | Status: AC
  Administered 2020-01-26: 1000 mL via INTRAVENOUS

## 2020-01-26 MED ORDER — ACETAMINOPHEN 325 MG PO TABS
650.0000 mg | ORAL_TABLET | Freq: Once | ORAL | Status: AC
  Administered 2020-01-26: 650 mg via ORAL
  Filled 2020-01-26: qty 2

## 2020-01-26 MED ORDER — SODIUM CHLORIDE 0.9 % IV SOLN
1.0000 g | Freq: Once | INTRAVENOUS | Status: AC
  Administered 2020-01-26: 1 g via INTRAVENOUS
  Filled 2020-01-26: qty 10

## 2020-01-26 NOTE — ED Provider Notes (Signed)
[facility]-EMERGENCY DEPT Provider Note   CSN: [id number] Arrival date & time: 01/26/20  8325     History Chief Complaint  Patient presents with  . Shortness of Breath    Chris Proctor is a 29 y.o. male.  Patient presents with cough, congestion, shortness of breath on going for 4-5 days.  He states he has a history of asthma, and h/o frequent sinus infections.  He saw his doctor who started him on a Zpak just a few days ago.  He presents as he feels his symptoms haven't improved, and presents to the ER for evaluation.   Shortness of Breath Associated symptoms: cough, fever and sore throat   Associated symptoms: no abdominal pain, no chest pain, no ear pain and no rash        Past Medical History:  Diagnosis Date  . ADHD (attention deficit hyperactivity disorder)   . Anxiety   . Asthma   . Depression   . Headache    hx of migraines   . MRSA infection    hx of in 2006 and 2009   . PONV (postoperative nausea and vomiting)   . Ureterolithiasis     Patient Active Problem List   Diagnosis Date Noted  . Reducible left inguinal hernia 12/04/2014    Past Surgical History:  Procedure Laterality Date  . CYSTOSCOPY WITH STENT PLACEMENT Right 06/20/2019   Procedure: CYSTOSCOPY WITH STENT PLACEMENT retrograde pylegram;  Surgeon: Rene Paci, MD;  Location: [facility];  Service: Urology;  Laterality: Right;  . EXTRACORPOREAL SHOCK WAVE LITHOTRIPSY Right 06/24/2019   Procedure: EXTRACORPOREAL SHOCK WAVE LITHOTRIPSY (ESWL);  Surgeon: Malen Gauze, MD;  Location: [facility];  Service: Urology;  Laterality: Right;  . INGUINAL HERNIA REPAIR Left 12/05/2014   Procedure: LEFT INGUINAL HERNIA REPAIR WITH  MESH;  Surgeon: Darnell Level, MD;  Location: [facility];  Service: General;  Laterality: Left;  . INSERTION OF MESH Left 12/05/2014   Procedure: INSERTION OF MESH;  Surgeon: Darnell Level, MD;  Location: [facility];  Service: General;   Laterality: Left;  . KNEE ARTHROSCOPY WITH LATERAL MENISECTOMY Right 07/29/2018   Procedure: RIGHT KNEE ARTHROSCOPY;  Surgeon: Bjorn Pippin, MD;  Location: [facility];  Service: Orthopedics;  Laterality: Right;  . MOUTH SURGERY         Family History  Problem Relation Age of Onset  . Hyperlipidemia Mother     Social History   Tobacco Use  . Smoking status: Never Smoker  . Smokeless tobacco: Never Used  Vaping Use  . Vaping Use: Never used  Substance Use Topics  . Alcohol use: Yes    Alcohol/week: 2.0 standard drinks    Types: 2 Cans of beer per week    Comment: 1-2 x per week or beer on weekend  . Drug use: No    Home Medications Prior to Admission medications   Medication Sig Start Date End Date Taking? Authorizing Provider  albuterol (PROVENTIL HFA;VENTOLIN HFA) 108 (90 BASE) MCG/ACT inhaler Inhale 1 puff into the lungs every 6 (six) hours as needed for wheezing or shortness of breath.   Yes [provider]  azithromycin (ZITHROMAX) 250 MG tablet Take 250-500 mg by mouth See admin instructions. 500mg  on day 1 250mg  on day 2-5 01/24/20  Yes [provider]  buPROPion (WELLBUTRIN XL) 150 MG 24 hr tablet Take 150 mg by mouth daily.  06/05/16  Yes [provider]  Chlorpheniramine-DM (CORICIDIN HBP COUGH/COLD PO)  Take 1 tablet by mouth as needed (congestion).   Yes [provider]  guaiFENesin (MUCINEX) 600 MG 12 hr tablet Take 600 mg by mouth 2 (two) times daily as needed for cough.   Yes [provider]  ibuprofen (ADVIL) 200 MG tablet Take 400 mg by mouth every 6 (six) hours as needed for fever, headache or mild pain.   Yes [provider]  methylphenidate 36 MG PO CR tablet Take 36 mg by mouth daily after breakfast. 05/10/19  Yes [provider]  sertraline (ZOLOFT) 100 MG tablet Take 150 mg by mouth daily.  11/02/14  Yes [provider]  triamcinolone (NASACORT ALLERGY 24HR) 55 MCG/ACT AERO  nasal inhaler Place 1 spray into the nose 2 (two) times daily.   Yes [provider]  tamsulosin (FLOMAX) 0.4 MG CAPS capsule Take 1 capsule (0.4 mg total) by mouth daily after supper. Patient not taking: Reported on 01/26/2020 06/24/19   Malen Gauze, MD    Allergies    Patient has no known allergies.  Review of Systems   Review of Systems  Constitutional: Positive for fever.  HENT: Positive for sore throat. Negative for ear pain.   Eyes: Negative for pain.  Respiratory: Positive for cough.   Cardiovascular: Negative for chest pain.  Gastrointestinal: Negative for abdominal pain.  Genitourinary: Negative for flank pain.  Musculoskeletal: Negative for back pain.  Skin: Negative for color change and rash.  Neurological: Negative for syncope.  All other systems reviewed and are negative.   Physical Exam Updated Vital Signs BP 115/84   Pulse (!) 106   Temp 99.8 F (37.7 C) (Oral)   Resp 18   Ht 5\' 11"  (1.803 m)   Wt 113.4 kg   SpO2 100%   BMI 34.87 kg/m   Physical Exam Constitutional:      General: He is not in acute distress.    Appearance: He is well-developed.  HENT:     Head: Normocephalic.     Mouth/Throat:     Mouth: Mucous membranes are moist.  Eyes:     Extraocular Movements: Extraocular movements intact.  Cardiovascular:     Rate and Rhythm: Tachycardia present.  Pulmonary:     Effort: Tachypnea present.     Breath sounds: No wheezing.  Abdominal:     Palpations: Abdomen is soft.  Musculoskeletal:     Right lower leg: No edema.     Left lower leg: No edema.  Skin:    General: Skin is warm.     Capillary Refill: Capillary refill takes less than 2 seconds.  Neurological:     General: No focal deficit present.     Mental Status: He is alert.     ED Results / Procedures / Treatments   Labs (all labs ordered are listed, but only abnormal results are displayed) Labs Reviewed  RESP PANEL BY RT-PCR (FLU A&B, COVID) ARPGX2 - Abnormal;  Notable for the following components:      Result Value   Influenza A by PCR POSITIVE (*)    All other components within normal limits  CBC WITH DIFFERENTIAL/PLATELET - Abnormal; Notable for the following components:   Platelets 142 (*)    Lymphs Abs 0.6 (*)    All other components within normal limits  COMPREHENSIVE METABOLIC PANEL - Abnormal; Notable for the following components:   Glucose, Bld 129 (*)    ALT 54 (*)    All other components within normal limits  CULTURE, BLOOD (ROUTINE  X 2)  CULTURE, BLOOD (ROUTINE X 2)  URINE CULTURE  LACTIC ACID, PLASMA  PROTIME-INR  APTT  LACTIC ACID, PLASMA  URINALYSIS, ROUTINE W REFLEX MICROSCOPIC    EKG EKG Interpretation  Date/Time:  Wednesday January 26 2020 07:56:17 EST Ventricular Rate:  134 PR Interval:    QRS Duration: 73 QT Interval:  272 QTC Calculation: 406 R Axis:   43 Text Interpretation: Sinus tachycardia Borderline T wave abnormalities No significant change since last tracing Confirmed by Lorre Nick ([id number]) on 01/26/2020 8:39:22 AM   Radiology DG Chest 1 View  Result Date: 01/26/2020 CLINICAL DATA:  Shortness of breath with cough and wheezing EXAM: CHEST  1 VIEW COMPARISON:  August 01, 2018 FINDINGS: Lungs are clear. Heart size and pulmonary vascularity are normal. No adenopathy. No bone lesions. IMPRESSION: Lungs clear.  Cardiac silhouette normal. Electronically Signed   By: Bretta Bang III M.D.   On: 01/26/2020 08:41    Procedures Procedures (including critical care time)  Medications Ordered in ED Medications  sodium chloride 0.9 % bolus 1,000 mL (1,000 mLs Intravenous New Bag/Given 01/26/20 0844)  sodium chloride 0.9 % bolus 1,000 mL (1,000 mLs Intravenous New Bag/Given 01/26/20 0914)  cefTRIAXone (ROCEPHIN) 1 g in sodium chloride 0.9 % 100 mL IVPB (0 g Intravenous Stopped 01/26/20 0914)  acetaminophen (TYLENOL) tablet 650 mg (650 mg Oral Given 01/26/20 9702)    ED Course  I have reviewed the triage  vital signs and the nursing notes.  Pertinent labs & imaging results that were available during my care of the patient were reviewed by me and considered in my medical decision making (see chart for details).    MDM Rules/Calculators/A&P                          Patient presents with fevers and tachycardia.  Concern for possible sepsis, perhaps pneumonia.  Blood cultures ordered, antibiotics stared, fluids started for 30cc/kg.  Viral studies also sent.  Labs however, show normal WBC, and normal lactic, normal chest xay.  Viral study shows influenza.  Advised fluids, rest and isolation until symptom abatement.  Advise immediate return for difficulty breathing or worsening symptoms.      Final Clinical Impression(s) / ED Diagnoses Final diagnoses:  Influenza    Rx / DC Orders ED Discharge Orders    None       Cheryll Cockayne, MD 01/26/20 1025

## 2020-01-26 NOTE — Discharge Instructions (Addendum)
Call your primary care doctor or specialist as discussed in the next 2-3 days.   Return immediately back to the ER if:  Your symptoms worsen within the next 12-24 hours. You develop new symptoms such as new fevers, persistent vomiting, new pain, shortness of breath, or new weakness or numbness, or if you have any other concerns.  

## 2020-01-26 NOTE — ED Triage Notes (Signed)
SHOB x12 days per patient. Hx of asthma. Wheezing noted. On azithromycin, taken 2/5 tablets.

## 2020-01-31 LAB — CULTURE, BLOOD (ROUTINE X 2)
Culture: NO GROWTH
Culture: NO GROWTH

## 2020-04-06 DIAGNOSIS — N200 Calculus of kidney: Secondary | ICD-10-CM | POA: Diagnosis not present

## 2020-04-06 DIAGNOSIS — S61411A Laceration without foreign body of right hand, initial encounter: Secondary | ICD-10-CM | POA: Diagnosis not present

## 2020-04-06 DIAGNOSIS — F329 Major depressive disorder, single episode, unspecified: Secondary | ICD-10-CM | POA: Diagnosis not present

## 2020-04-06 DIAGNOSIS — F900 Attention-deficit hyperactivity disorder, predominantly inattentive type: Secondary | ICD-10-CM | POA: Diagnosis not present

## 2020-04-06 DIAGNOSIS — Z0001 Encounter for general adult medical examination with abnormal findings: Secondary | ICD-10-CM | POA: Diagnosis not present

## 2020-04-06 DIAGNOSIS — G43009 Migraine without aura, not intractable, without status migrainosus: Secondary | ICD-10-CM | POA: Diagnosis not present

## 2020-04-06 DIAGNOSIS — F419 Anxiety disorder, unspecified: Secondary | ICD-10-CM | POA: Diagnosis not present

## 2020-07-04 DIAGNOSIS — F900 Attention-deficit hyperactivity disorder, predominantly inattentive type: Secondary | ICD-10-CM | POA: Diagnosis not present

## 2020-07-17 DIAGNOSIS — N200 Calculus of kidney: Secondary | ICD-10-CM | POA: Diagnosis not present

## 2020-10-04 DIAGNOSIS — F988 Other specified behavioral and emotional disorders with onset usually occurring in childhood and adolescence: Secondary | ICD-10-CM | POA: Diagnosis not present

## 2020-10-04 DIAGNOSIS — F419 Anxiety disorder, unspecified: Secondary | ICD-10-CM | POA: Diagnosis not present

## 2021-01-04 DIAGNOSIS — F419 Anxiety disorder, unspecified: Secondary | ICD-10-CM | POA: Diagnosis not present

## 2021-01-04 DIAGNOSIS — F988 Other specified behavioral and emotional disorders with onset usually occurring in childhood and adolescence: Secondary | ICD-10-CM | POA: Diagnosis not present

## 2021-01-21 IMAGING — CT CT RENAL STONE PROTOCOL
2 of 4 series · 16 of 46 positions shown, 18 images · non-contrast
Comparison: CT the abdomen and pelvis 09/03/2018.

CLINICAL DATA: 29-year-old male with history of right-sided flank
pain for the past 2 days, worsening this morning.

EXAM:
CT ABDOMEN AND PELVIS WITHOUT CONTRAST
TECHNIQUE: Multidetector CT imaging of the abdomen and pelvis was performed
following the standard protocol without IV contrast.

[Series 2: axial st · axial · 0.77mm/px · z∈[+731,+1211]mm · 13 of 106 slices shown, 15 images]
[im 5/106  soft-tissue]
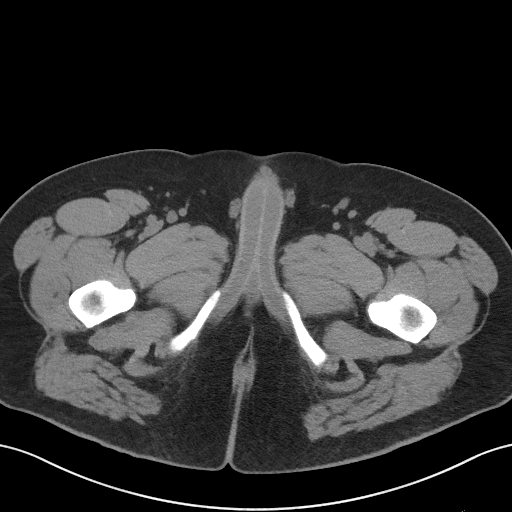
[im 5/106  bone]
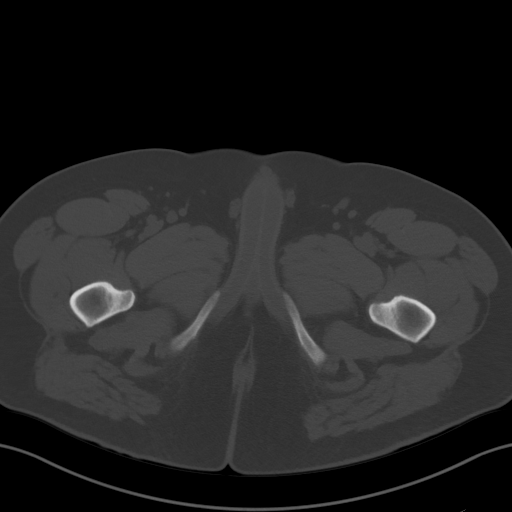
[im 13/106  soft-tissue]
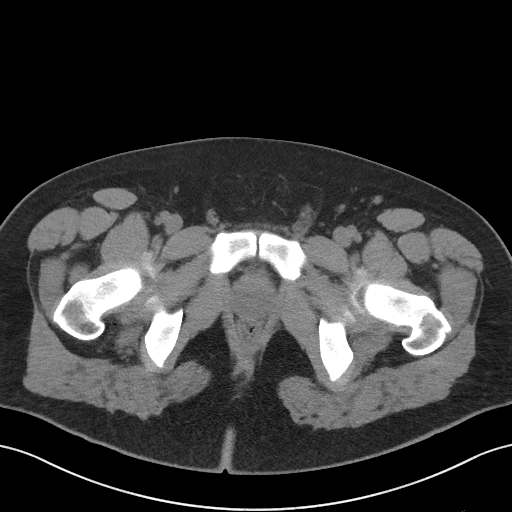
[im 21/106  soft-tissue]
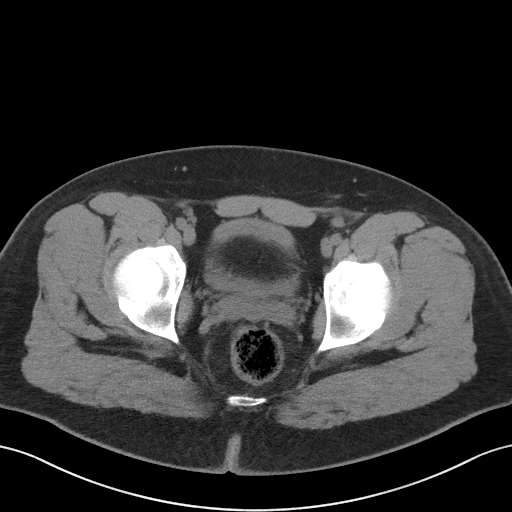
[im 29/106  soft-tissue]
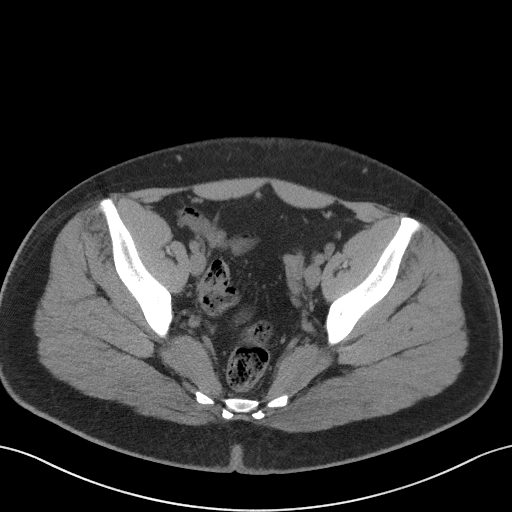
[im 37/106  soft-tissue]
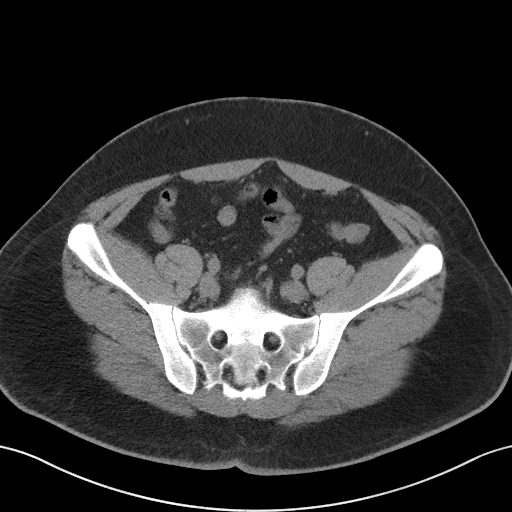
[im 45/106  soft-tissue]
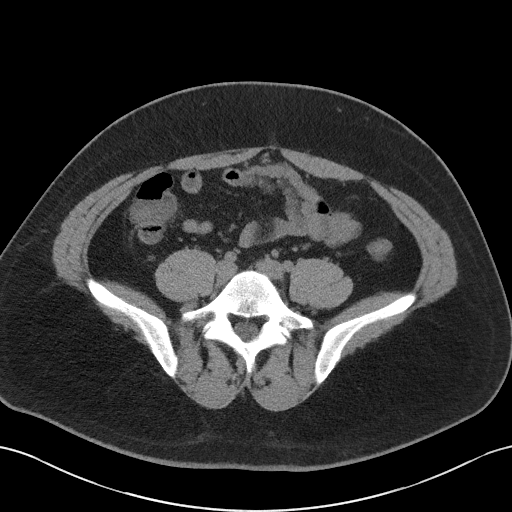
[im 53/106  soft-tissue]
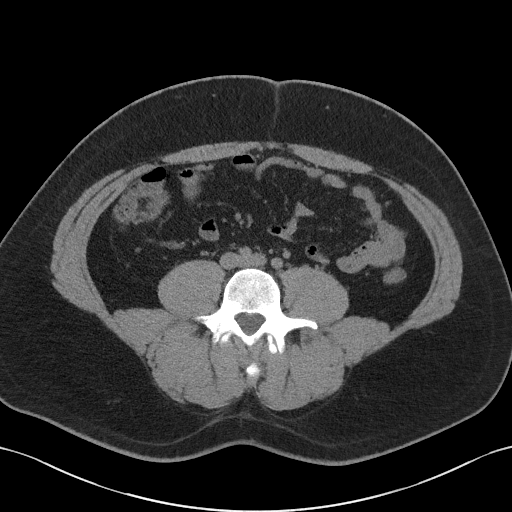
[im 61/106  soft-tissue]
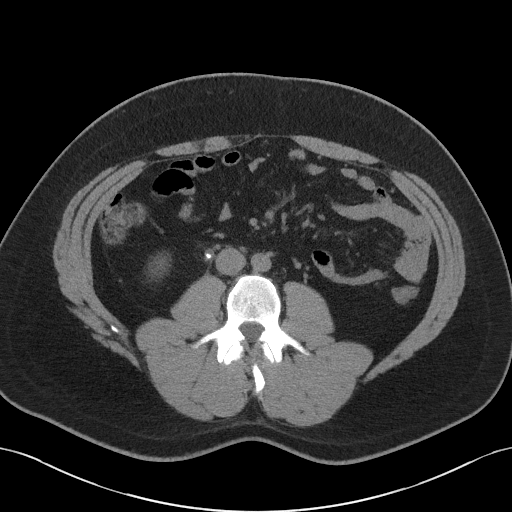
[im 69/106  soft-tissue]
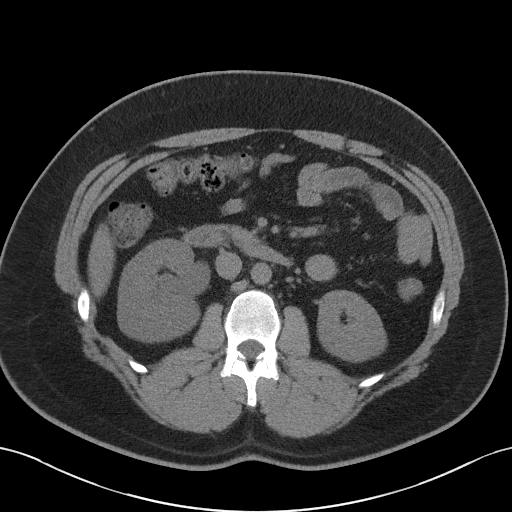
[im 69/106  bone]
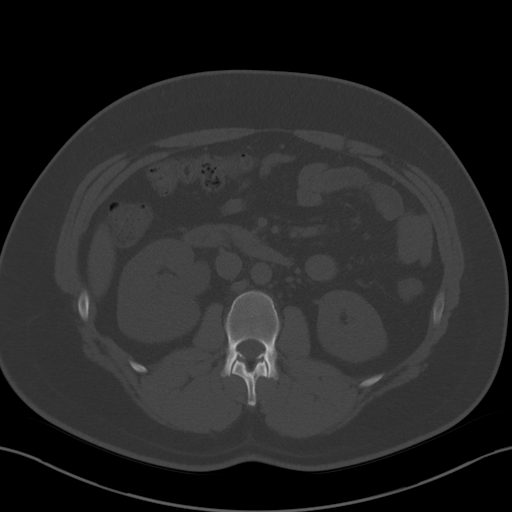
[im 77/106  soft-tissue]
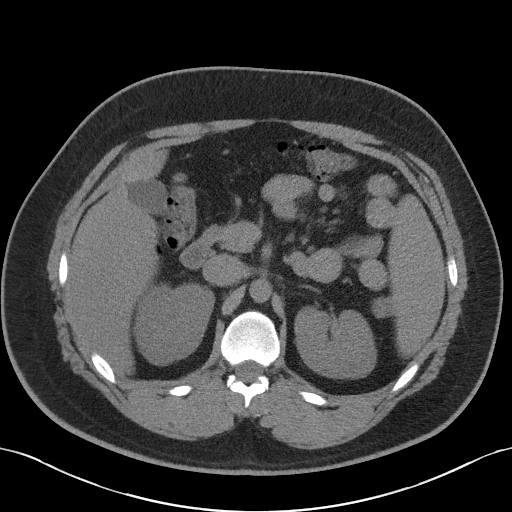
[im 85/106  soft-tissue]
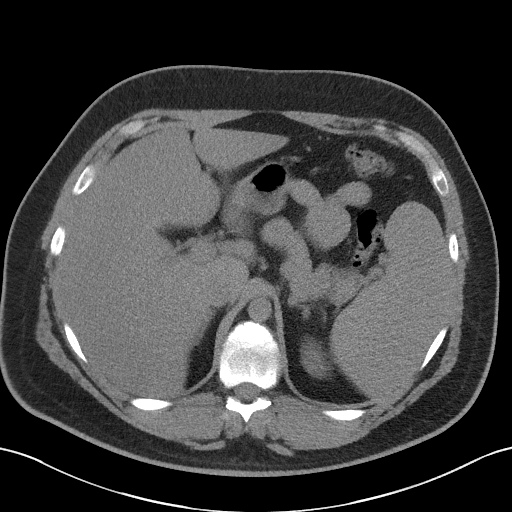
[im 93/106  soft-tissue]
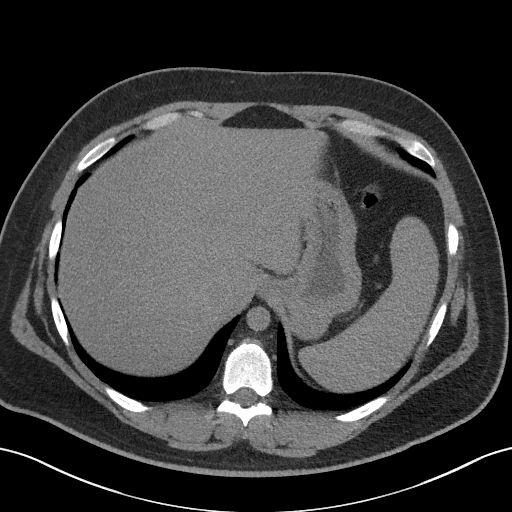
[im 101/106  soft-tissue]
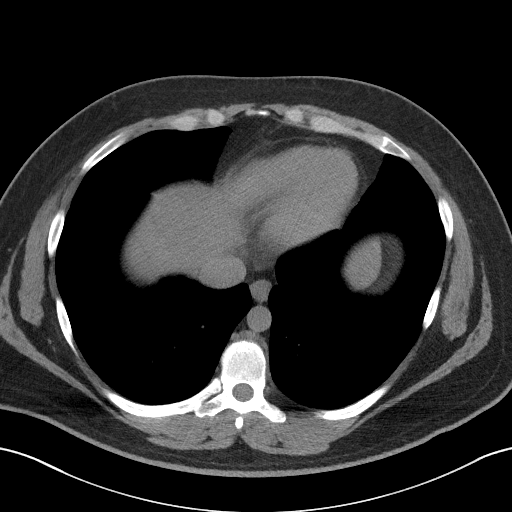

[Series 4: coronal st · coronal · 0.84mm/px · 3 of 98 slices shown]
[im 33/98  soft-tissue]
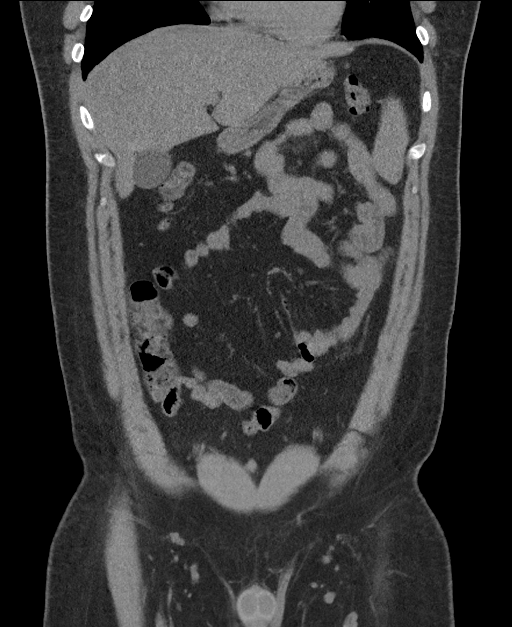
[im 44/98  soft-tissue]
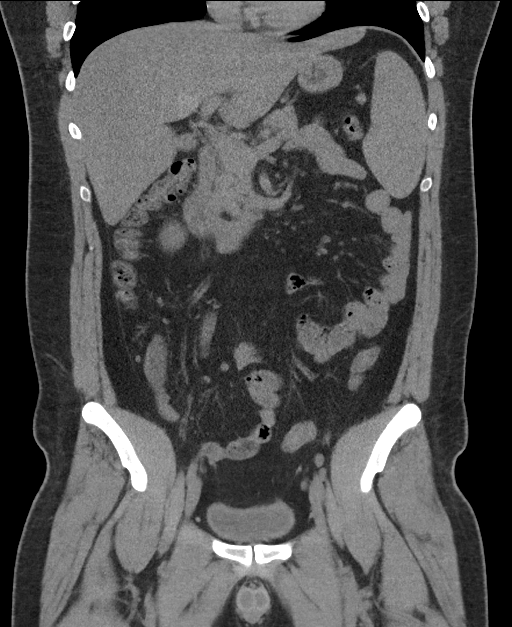
[im 54/98  soft-tissue]
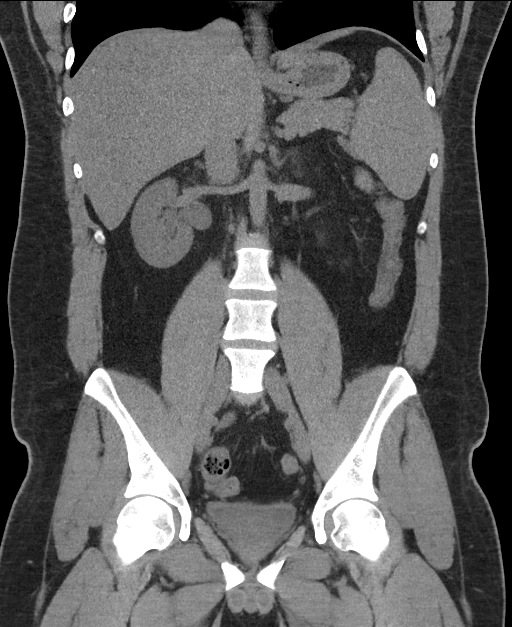

[16 of 46 positions shown; findings below may reference images not displayed]

FINDINGS: Lower chest: Unremarkable.

Hepatobiliary: Diffuse low attenuation throughout the hepatic
parenchyma, indicative of hepatic steatosis. No definite suspicious
cystic or solid hepatic lesions are confidently identified on
today's noncontrast CT examination. Unenhanced appearance of the
gallbladder is normal.

Pancreas: No definite pancreatic mass or peripancreatic fluid
collections or inflammatory changes are noted on today's noncontrast
CT examination.

Spleen: Unremarkable.

Adrenals/Urinary Tract: 8 mm calculus in the proximal third of the
right ureter with mild to moderate proximal right
hydroureteronephrosis. No additional calculi are noted within the
collecting system of either kidney, along the course of the left
ureter or within the lumen of the urinary bladder. No left
hydroureteronephrosis. Unenhanced appearance of the kidneys and
bilateral adrenal glands is otherwise normal. Unenhanced appearance
of the urinary bladder is normal.

Stomach/Bowel: Unenhanced appearance of the stomach is normal. No
pathologic dilatation of small bowel or colon. Normal appendix.

Vascular/Lymphatic: No atherosclerotic calcifications in the
abdominal aorta or pelvic vasculature. No lymphadenopathy noted in
the abdomen or pelvis.

Reproductive: Prostate gland and seminal vesicles are unremarkable
in appearance.

Other: No significant volume of ascites.  No pneumoperitoneum.

Musculoskeletal: There are no aggressive appearing lytic or blastic
lesions noted in the visualized portions of the skeleton.
IMPRESSION: 1. 8 mm obstructing calculus in the proximal third of the right
ureter with mild to moderate proximal right hydroureteronephrosis.
2. Hepatic steatosis.

## 2021-02-02 DIAGNOSIS — N200 Calculus of kidney: Secondary | ICD-10-CM | POA: Diagnosis not present

## 2021-02-02 DIAGNOSIS — R1084 Generalized abdominal pain: Secondary | ICD-10-CM | POA: Diagnosis not present

## 2021-04-09 DIAGNOSIS — Z0000 Encounter for general adult medical examination without abnormal findings: Secondary | ICD-10-CM | POA: Diagnosis not present

## 2021-04-12 DIAGNOSIS — G43009 Migraine without aura, not intractable, without status migrainosus: Secondary | ICD-10-CM | POA: Diagnosis not present

## 2021-04-12 DIAGNOSIS — F900 Attention-deficit hyperactivity disorder, predominantly inattentive type: Secondary | ICD-10-CM | POA: Diagnosis not present

## 2021-04-12 DIAGNOSIS — N200 Calculus of kidney: Secondary | ICD-10-CM | POA: Diagnosis not present

## 2021-04-12 DIAGNOSIS — Z0000 Encounter for general adult medical examination without abnormal findings: Secondary | ICD-10-CM | POA: Diagnosis not present

## 2021-04-12 DIAGNOSIS — F329 Major depressive disorder, single episode, unspecified: Secondary | ICD-10-CM | POA: Diagnosis not present

## 2021-12-14 ENCOUNTER — Ambulatory Visit
Admission: RE | Admit: 2021-12-14 | Discharge: 2021-12-14 | Disposition: A | Discharge status: Home / Self Care | Payer: BC Managed Care – PPO | Source: Ambulatory Visit

## 2021-12-14 VITALS — BP 165/90 | HR 94 | Temp 98.3°F | Resp 16

## 2021-12-14 DIAGNOSIS — J22 Unspecified acute lower respiratory infection: Secondary | ICD-10-CM | POA: Diagnosis not present

## 2021-12-14 DIAGNOSIS — J4521 Mild intermittent asthma with (acute) exacerbation: Secondary | ICD-10-CM

## 2021-12-14 DIAGNOSIS — J309 Allergic rhinitis, unspecified: Secondary | ICD-10-CM

## 2021-12-14 MED ORDER — IBUPROFEN 400 MG PO TABS: 400.0000 mg | ORAL_TABLET | Freq: Three times a day (TID) | ORAL | 0 refills | Status: AC | PRN

## 2021-12-14 MED ORDER — ALBUTEROL SULFATE HFA 108 (90 BASE) MCG/ACT IN AERS: 2.0000 | INHALATION_SPRAY | Freq: Four times a day (QID) | RESPIRATORY_TRACT | 0 refills | Status: DC | PRN

## 2021-12-14 MED ORDER — AZITHROMYCIN 250 MG PO TABS: ORAL_TABLET | ORAL | 0 refills | Status: AC

## 2021-12-14 MED ORDER — DEXAMETHASONE SODIUM PHOSPHATE 10 MG/ML IJ SOLN
10.0000 mg | Freq: Once | INTRAMUSCULAR | Status: AC
  Administered 2021-12-14: 10 mg via INTRAMUSCULAR

## 2021-12-14 MED ORDER — CETIRIZINE HCL 10 MG PO TABS: 10.0000 mg | ORAL_TABLET | Freq: Every day | ORAL | 1 refills | Status: DC

## 2021-12-14 MED ORDER — FLUTICASONE PROPIONATE 50 MCG/ACT NA SUSP: 1.0000 | Freq: Every day | NASAL | 2 refills | Status: AC

## 2021-12-14 MED ORDER — ALBUTEROL SULFATE (2.5 MG/3ML) 0.083% IN NEBU
2.5000 mg | INHALATION_SOLUTION | Freq: Once | RESPIRATORY_TRACT | Status: AC
  Administered 2021-12-14: 2.5 mg via RESPIRATORY_TRACT

## 2021-12-14 NOTE — Discharge Instructions (Addendum)
Difficult for me to be certain whether or not the inflammation in your upper and lower airways is due to allergy exposure or due to exposure to a virus, the treatment for this is the same.  Because I am providing you a steroid injection today to reduce your inflammation, I believe it is a foregone conclusion that you will require an antibiotic before all of this is resolved.  For this reason and because you have pain radiating from your sinuses to your teeth and your breath sounds are rough, I provided you with a prescription for an antibiotic as well.  Please read below to learn more about the medications, dosages and frequencies that I recommend to help alleviate your symptoms and to get you feeling better soon:   Decadron IM (dexamethasone):  To quickly address your significant respiratory inflammation, you were provided with an injection of Decadron in the office today.  You should continue to feel the full benefit of the steroid for the next 12-24 hours.    Z-Pak (azithromycin): Please take 2 tablets the first day, then take 1 tablet daily every day thereafter until complete.  Zyrtec (cetirizine): This is an excellent second-generation antihistamine that helps to reduce respiratory inflammatory response to environmental allergens.  In some patients, this medication can cause daytime sleepiness so I recommend that you take 1 tablet daily at bedtime.     Flonase (fluticasone): This is a steroid nasal spray that you use once daily, 1 spray in each nare.  This medication does not work well if you decide to use it only used as you feel you need to, it works best used on a daily basis.  After 3 to 5 days of use, you will notice significant reduction of the inflammation and mucus production that is currently being caused by exposure to allergens, whether seasonal or environmental.  The most common side effect of this medication is nosebleeds.  If you experience a nosebleed, please discontinue use for 1 week,  then feel free to resume.  I have provided you with a prescription.     ProAir, Ventolin, Proventil (albuterol): This inhaled medication contains a short acting beta agonist bronchodilator.  This medication works on the smooth muscle that opens and constricts of your airways by relaxing the muscle.  The result of relaxation of the smooth muscle is increased air movement and improved work of breathing.  This is a short acting medication that can be used every 4-6 hours as needed for increased work of breathing, shortness of breath, wheezing and excessive coughing.  I have provided you with a prescription.    Advil, Motrin (ibuprofen): This is a good anti-inflammatory medication which not only addresses aches, pains but also significantly reduces soft tissue inflammation of the upper airways that causes sinus and nasal congestion as well as inflammation of the lower airways which makes you feel like your breathing is constricted or your cough feel tight.  I recommend that you take 400 mg every 8 hours as needed.     Please follow-up within the next 5-7 days either with your primary care provider or urgent care if your symptoms do not resolve.  If you do not have a primary care provider, we will assist you in finding one.        Thank you for visiting urgent care today.  We appreciate the opportunity to participate in your care.

## 2021-12-14 NOTE — ED Triage Notes (Signed)
Pt c/o headache, dental pain, lack of appetite, fatigue, and congestion (started today).  Started: 2 days ago  Home interventions: advil

## 2021-12-14 NOTE — ED Provider Notes (Signed)
[facility]    CSN: [id number] Arrival date & time: 12/14/21  1831    HISTORY   Chief Complaint  Patient presents with   Nasal Congestion   Fatigue   Headache   Facial Pain   HPI Chris Proctor is a pleasant, 31 y.o. male who presents to urgent care today. Pt c/o 2-day history of headache, dental pain, lack of appetite, fatigue, states congestion began today.  Patient states he has been taking Advil without meaningful relief of symptoms.  Patient states he is concerned he has a sinus infection.  Patient reports a history of allergies and asthma, not currently taking medications for either.  Patient denies nausea, vomiting, diarrhea, sore throat, loss of taste or smell, known sick contacts.  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  The history is provided by the patient.   Past Medical History:  Diagnosis Date   ADHD (attention deficit hyperactivity disorder)    Anxiety    Asthma    Depression    Headache    hx of migraines    MRSA infection    hx of in 2006 and 2009    PONV (postoperative nausea and vomiting)    Ureterolithiasis    Patient Active Problem List   Diagnosis Date Noted   Reducible left inguinal hernia 12/04/2014   Past Surgical History:  Procedure Laterality Date   CYSTOSCOPY WITH STENT PLACEMENT Right 06/20/2019   Procedure: CYSTOSCOPY WITH STENT PLACEMENT retrograde pylegram;  Surgeon: Ceasar Mons, MD;  Location: [facility];  Service: Urology;  Laterality: Right;   EXTRACORPOREAL SHOCK WAVE LITHOTRIPSY Right 06/24/2019   Procedure: EXTRACORPOREAL SHOCK WAVE LITHOTRIPSY (ESWL);  Surgeon: Cleon Gustin, MD;  Location: [facility];  Service: Urology;  Laterality: Right;   INGUINAL HERNIA REPAIR Left 12/05/2014   Procedure: LEFT INGUINAL HERNIA REPAIR WITH  MESH;  Surgeon: Armandina Gemma, MD;  Location: [facility];  Service: General;  Laterality:  Left;   INSERTION OF MESH Left 12/05/2014   Procedure: INSERTION OF MESH;  Surgeon: Armandina Gemma, MD;  Location: [facility];  Service: General;  Laterality: Left;   KNEE ARTHROSCOPY WITH LATERAL MENISECTOMY Right 07/29/2018   Procedure: RIGHT KNEE ARTHROSCOPY;  Surgeon: Hiram Gash, MD;  Location: [place];  Service: Orthopedics;  Laterality: Right;   MOUTH SURGERY      Home Medications    Prior to Admission medications   Not on File    Family History Family History  Problem Relation Age of Onset   Hyperlipidemia Mother    Social History Social History   Tobacco Use   Smoking status: Never   Smokeless tobacco: Never  Vaping Use   Vaping Use: Never used  Substance Use Topics   Alcohol use: Yes    Alcohol/week: 2.0 standard drinks of alcohol    Types: 2 Cans of beer per week    Comment: 1-2 x per week or beer on weekend   Drug use: No   Allergies   Patient has no known allergies.  Review of Systems Review of Systems Pertinent findings revealed after performing a 14 point review of systems has been noted in the history of present illness.  Physical Exam Triage Vital Signs ED Triage Vitals  Enc Vitals Group     BP 12/22/20 0827 (!) 147/82     Pulse Rate 12/22/20 0827 72     Resp 12/22/20 0827 18  Temp 12/22/20 0827 98.3 F (36.8 C)     Temp Source 12/22/20 0827 Oral     SpO2 12/22/20 0827 98 %     Weight --      Height --      Head Circumference --      Peak Flow --      Pain Score 12/22/20 0826 5     Pain Loc --      Pain Edu? --      Excl. in GC? --   No data found.  Updated Vital Signs BP (!) 165/90 (BP Location: Right Arm)   Pulse 94   Temp 98.3 F (36.8 C) (Oral)   Resp 16   SpO2 98%   Physical Exam Vitals and nursing note reviewed.  Constitutional:      General: He is not in acute distress.    Appearance: Normal appearance. He is not ill-appearing.  HENT:     Head: Normocephalic and atraumatic.     Salivary Glands: Right  salivary gland is not diffusely enlarged or tender. Left salivary gland is not diffusely enlarged or tender.     Right Ear: Ear canal and external ear normal. No drainage. A middle ear effusion is present. There is no impacted cerumen. Tympanic membrane is bulging. Tympanic membrane is not injected or erythematous.     Left Ear: Ear canal and external ear normal. No drainage. A middle ear effusion is present. There is no impacted cerumen. Tympanic membrane is bulging. Tympanic membrane is not injected or erythematous.     Ears:     Comments: Bilateral EACs normal, both TMs bulging with clear fluid    Nose: Rhinorrhea present. No nasal deformity, septal deviation, signs of injury, nasal tenderness, mucosal edema or congestion. Rhinorrhea is clear.     Right Nostril: Occlusion present. No foreign body, epistaxis or septal hematoma.     Left Nostril: Occlusion present. No foreign body, epistaxis or septal hematoma.     Right Turbinates: Enlarged, swollen and pale.     Left Turbinates: Enlarged, swollen and pale.     Right Sinus: No maxillary sinus tenderness or frontal sinus tenderness.     Left Sinus: No maxillary sinus tenderness or frontal sinus tenderness.     Mouth/Throat:     Lips: Pink. No lesions.     Mouth: Mucous membranes are moist. No oral lesions.     Pharynx: Oropharynx is clear. Uvula midline. No posterior oropharyngeal erythema or uvula swelling.     Tonsils: No tonsillar exudate. 0 on the right. 0 on the left.     Comments: Postnasal drip Eyes:     General: Lids are normal.        Right eye: No discharge.        Left eye: No discharge.     Extraocular Movements: Extraocular movements intact.     Conjunctiva/sclera: Conjunctivae normal.     Right eye: Right conjunctiva is not injected.     Left eye: Left conjunctiva is not injected.  Neck:     Trachea: Trachea and phonation normal.  Cardiovascular:     Rate and Rhythm: Normal rate and regular rhythm.     Pulses: Normal  pulses.     Heart sounds: Normal heart sounds. No murmur heard.    No friction rub. No gallop.  Pulmonary:     Effort: Pulmonary effort is normal. No accessory muscle usage, prolonged expiration or respiratory distress.     Breath sounds: No stridor, decreased  air movement or transmitted upper airway sounds. Examination of the right-upper field reveals decreased breath sounds. Examination of the left-upper field reveals decreased breath sounds. Examination of the right-middle field reveals decreased breath sounds. Examination of the left-middle field reveals decreased breath sounds. Examination of the right-lower field reveals decreased breath sounds. Examination of the left-lower field reveals decreased breath sounds. Decreased breath sounds present. No wheezing, rhonchi or rales.  Chest:     Chest wall: No tenderness.  Musculoskeletal:        General: Normal range of motion.     Cervical back: Normal range of motion and neck supple. Normal range of motion.  Lymphadenopathy:     Cervical: No cervical adenopathy.  Skin:    General: Skin is warm and dry.     Findings: No erythema or rash.  Neurological:     General: No focal deficit present.     Mental Status: He is alert and oriented to person, place, and time.  Psychiatric:        Mood and Affect: Mood normal.        Behavior: Behavior normal.     Visual Acuity Right Eye Distance:   Left Eye Distance:   Bilateral Distance:    Right Eye Near:   Left Eye Near:    Bilateral Near:     [facility] Couse / Diagnostics / Procedures:     Radiology No results found.  Procedures Procedures (including critical care time) EKG  Pending results:  Labs Reviewed - No data to display  Medications Ordered in UC: Medications  dexamethasone (DECADRON) injection 10 mg (has no administration in time range)  albuterol (PROVENTIL) (2.5 MG/3ML) 0.083% nebulizer solution 2.5 mg (2.5 mg Nebulization Given 12/14/21 1921)    UC Diagnoses / Final  Clinical Impressions(s)   I have reviewed the triage vital signs and the nursing notes.  Pertinent labs & imaging results that were available during my care of the patient were reviewed by me and considered in my medical decision making (see chart for details).    Final diagnoses:  Acute respiratory infection  Mild intermittent asthma with (acute) exacerbation  Allergic rhinitis, unspecified seasonality, unspecified trigger   Patient reported significant improvement of work of breathing post nebulized albuterol treatment.  Breath sounds are coarse on repeat auscultation.  Patient provided with prescription for azithromycin for probable bronchitis and presence of bacteria in lower respiratory tract due to prolonged bronchoconstriction.  Patient provided with allergy medications as have previously been prescribed.  Patient advised to use albuterol inhaler 4 times daily for the next several days.  Decadron injection provided during visit today.  Return precautions advised. ED Prescriptions     Medication Sig Dispense Auth. Provider   cetirizine (ZYRTEC ALLERGY) 10 MG tablet Take 1 tablet (10 mg total) by mouth at bedtime. 90 tablet Theadora Rama Scales, PA-C   fluticasone (FLONASE) 50 MCG/ACT nasal spray Place 1 spray into both nostrils daily. Begin by using 2 sprays in each nare daily for 3 to 5 days, then decrease to 1 spray in each nare daily. 15.8 mL Theadora Rama Scales, PA-C   albuterol (VENTOLIN HFA) 108 (90 Base) MCG/ACT inhaler Inhale 2 puffs into the lungs every 6 (six) hours as needed for wheezing or shortness of breath (Cough). 18 g Theadora Rama Scales, PA-C   azithromycin (ZITHROMAX) 250 MG tablet Take 2 tablets (500 mg total) by mouth daily for 1 day, THEN 1 tablet (250 mg total) daily for 4 days. 6 tablet Lequita Halt,  Kaedan Richert Scales, PA-C   ibuprofen (ADVIL) 400 MG tablet Take 1 tablet (400 mg total) by mouth every 8 (eight) hours as needed for up to 30 doses. 30 tablet Theadora RamaMorgan,  Mar Walmer Scales, PA-C      PDMP not reviewed this encounter.  Disposition Upon Discharge:  Condition: stable for discharge home Home: take medications as prescribed; routine discharge instructions as discussed; follow up as advised.  Patient presented with an acute illness with associated systemic symptoms and significant discomfort requiring urgent management. In my opinion, this is a condition that a prudent lay person (someone who possesses an average knowledge of health and medicine) may potentially expect to result in complications if not addressed urgently such as respiratory distress, impairment of bodily function or dysfunction of bodily organs.   Routine symptom specific, illness specific and/or disease specific instructions were discussed with the patient and/or caregiver at length.   As such, the patient has been evaluated and assessed, work-up was performed and treatment was provided in alignment with urgent care protocols and evidence based medicine.  Patient/parent/caregiver has been advised that the patient may require follow up for further testing and treatment if the symptoms continue in spite of treatment, as clinically indicated and appropriate.  If the patient was tested for COVID-19, Influenza and/or RSV, then the patient/parent/guardian was advised to isolate at home pending the results of his/her diagnostic coronavirus test and potentially longer if they're positive. I have also advised pt that if his/her COVID-19 test returns positive, it's recommended to self-isolate for at least 10 days after symptoms first appeared AND until fever-free for 24 hours without fever reducer AND other symptoms have improved or resolved. Discussed self-isolation recommendations as well as instructions for household member/close contacts as per the [facility] and [place] DHHS, and also gave patient the COVID packet with this information.  Patient/parent/caregiver has been advised [facility]  in 3-5 days if no better; to PCP or the Emergency Department if new signs and symptoms develop, or if the current signs or symptoms continue to change or worsen for further workup, evaluation and treatment as clinically indicated and appropriate  The patient will follow up with their current PCP if and as advised. If the patient does not currently have a PCP we will assist them in obtaining one.   The patient may need specialty follow up if the symptoms continue, in spite of conservative treatment and management, for further workup, evaluation, consultation and treatment as clinically indicated and appropriate.  Patient/parent/caregiver verbalized understanding and agreement of plan as discussed.  All questions were addressed during visit.  Please see discharge instructions below for further details of plan.  Discharge Instructions:   Discharge Instructions      Difficult for me to be certain whether or not the inflammation in your upper and lower airways is due to allergy exposure or due to exposure to a virus, the treatment for this is the same.  Because I am providing you a steroid injection today to reduce your inflammation, I believe it is a foregone conclusion that you will require an antibiotic before all of this is resolved.  For this reason and because you have pain radiating from your sinuses to your teeth and your breath sounds are rough, I provided you with a prescription for an antibiotic as well.  Please read below to learn more about the medications, dosages and frequencies that I recommend to help alleviate your symptoms and to get you feeling better soon:  Decadron IM (dexamethasone):  To quickly address your significant respiratory inflammation, you were provided with an injection of Decadron in the office today.  You should continue to feel the full benefit of the steroid for the next 12-24 hours.    Z-Pak (azithromycin): Please take 2 tablets the first day, then take 1 tablet  daily every day thereafter until complete.  Zyrtec (cetirizine): This is an excellent second-generation antihistamine that helps to reduce respiratory inflammatory response to environmental allergens.  In some patients, this medication can cause daytime sleepiness so I recommend that you take 1 tablet daily at bedtime.     Flonase (fluticasone): This is a steroid nasal spray that you use once daily, 1 spray in each nare.  This medication does not work well if you decide to use it only used as you feel you need to, it works best used on a daily basis.  After 3 to 5 days of use, you will notice significant reduction of the inflammation and mucus production that is currently being caused by exposure to allergens, whether seasonal or environmental.  The most common side effect of this medication is nosebleeds.  If you experience a nosebleed, please discontinue use for 1 week, then feel free to resume.  I have provided you with a prescription.     ProAir, Ventolin, Proventil (albuterol): This inhaled medication contains a short acting beta agonist bronchodilator.  This medication works on the smooth muscle that opens and constricts of your airways by relaxing the muscle.  The result of relaxation of the smooth muscle is increased air movement and improved work of breathing.  This is a short acting medication that can be used every 4-6 hours as needed for increased work of breathing, shortness of breath, wheezing and excessive coughing.  I have provided you with a prescription.    Advil, Motrin (ibuprofen): This is a good anti-inflammatory medication which not only addresses aches, pains but also significantly reduces soft tissue inflammation of the upper airways that causes sinus and nasal congestion as well as inflammation of the lower airways which makes you feel like your breathing is constricted or your cough feel tight.  I recommend that you take 400 mg every 8 hours as needed.     Please follow-up within  the next 5-7 days either with your primary care provider or urgent care if your symptoms do not resolve.  If you do not have a primary care provider, we will assist you in finding one.        Thank you for visiting urgent care today.  We appreciate the opportunity to participate in your care.         This office note has been dictated using Teaching laboratory technician.  Unfortunately, this method of dictation can sometimes lead to typographical or grammatical errors.  I apologize for your inconvenience in advance if this occurs.  Please do not hesitate to reach out to me if clarification is needed.      Theadora Rama Scales, [place] 12/15/21 [phone]

## 2021-12-16 ENCOUNTER — Other Ambulatory Visit: Payer: Self-pay

## 2021-12-16 ENCOUNTER — Encounter (HOSPITAL_BASED_OUTPATIENT_CLINIC_OR_DEPARTMENT_OTHER): Payer: Self-pay | Admitting: Emergency Medicine

## 2021-12-16 DIAGNOSIS — J45909 Unspecified asthma, uncomplicated: Secondary | ICD-10-CM | POA: Insufficient documentation

## 2021-12-16 DIAGNOSIS — R0602 Shortness of breath: Secondary | ICD-10-CM | POA: Diagnosis not present

## 2021-12-16 DIAGNOSIS — Z7951 Long term (current) use of inhaled steroids: Secondary | ICD-10-CM | POA: Diagnosis not present

## 2021-12-16 MED ORDER — ALBUTEROL SULFATE HFA 108 (90 BASE) MCG/ACT IN AERS: 2.0000 | INHALATION_SPRAY | RESPIRATORY_TRACT | Status: DC | PRN

## 2021-12-16 MED ORDER — AEROCHAMBER PLUS FLO-VU LARGE MISC
1.0000 | Freq: Once | Status: AC
  Administered 2021-12-17: 1
  Filled 2021-12-16: qty 1

## 2021-12-16 MED ORDER — ALBUTEROL SULFATE (2.5 MG/3ML) 0.083% IN NEBU
2.5000 mg | INHALATION_SOLUTION | Freq: Once | RESPIRATORY_TRACT | Status: AC
  Administered 2021-12-16: 2.5 mg via RESPIRATORY_TRACT
  Filled 2021-12-16: qty 3

## 2021-12-16 MED ORDER — IPRATROPIUM-ALBUTEROL 0.5-2.5 (3) MG/3ML IN SOLN
3.0000 mL | Freq: Once | RESPIRATORY_TRACT | Status: AC
  Administered 2021-12-16: 3 mL via RESPIRATORY_TRACT
  Filled 2021-12-16: qty 3

## 2021-12-16 NOTE — ED Triage Notes (Signed)
Was seen for URI/ asthma type symptoms at [facility] on 12/12/2021. Now feeling like it is harder to take a deep breathe

## 2021-12-16 NOTE — ED Notes (Signed)
RT assessed pt in triage. Pts BLBS diminished, pt very tight not moving air well. Pt states he has never seen a pulmonologist or had a PFT for evaluation of his asthma/reactive airway. Pt given 5/5 and states his breathing is much better. Pts BLBS post treatment clear/diminished. RT will continue to monitor while at [facility] ED.

## 2021-12-17 ENCOUNTER — Emergency Department (HOSPITAL_BASED_OUTPATIENT_CLINIC_OR_DEPARTMENT_OTHER)
Admission: EM | Admit: 2021-12-17 | Discharge: 2021-12-17 | Disposition: A | Discharge status: Home / Self Care | Payer: BC Managed Care – PPO | Attending: Emergency Medicine | Admitting: Emergency Medicine

## 2021-12-17 DIAGNOSIS — R0602 Shortness of breath: Secondary | ICD-10-CM

## 2021-12-17 MED ORDER — DEXAMETHASONE 4 MG PO TABS
10.0000 mg | ORAL_TABLET | Freq: Once | ORAL | Status: AC
  Administered 2021-12-17: 10 mg via ORAL
  Filled 2021-12-17: qty 3

## 2021-12-17 NOTE — Discharge Instructions (Addendum)
You were seen today for ongoing shortness of breath and chest pressure.  You improved with a nebulizer.  You were given a second dose of steroids.  You likely continue to have some bronchospasm which could be related to viral infection.  You are being appropriately treated for pneumonia.  Continue your albuterol every 4 hours.

## 2021-12-17 NOTE — ED Notes (Signed)
Walking spo2 maintained at 99% on room air. HR ranging 88-92 Patient denies sob,or discomfort. No noted distress

## 2021-12-17 NOTE — ED Notes (Signed)
Pt ambulated with pulse Ox. Pt denies any ShOB pulse ox reading remained at 99% on RA.

## 2021-12-17 NOTE — ED Provider Notes (Signed)
[facility] EMERGENCY DEPT Provider Note   CSN: [id number] Arrival date & time: 12/16/21  2243     History  Chief Complaint  Patient presents with   Shortness of Breath    Chris Proctor is a 32 y.o. male.  HPI     This is a 31 year old male with a history of asthma who presents with ongoing shortness of breath.  Patient reports that he was seen and evaluated on Friday at urgent care.  He was treated for a sinus infection and an asthma exacerbation.  He was given a dose of Decadron.  He has been using his inhaler.  He is also on azithromycin for a sinus infection.  He states that he thought he was getting better.  However tonight he had worsening shortness of breath.  He states that normally his asthma is well controlled.  He is unsure of triggers.  He has not had any fevers.  No cough.  No other infectious symptoms.  Patient was evaluated by respiratory therapy prior to my evaluation.  He was administered a DuoNeb and reported significant improvement of his symptoms.  He is currently symptom-free.  Home Medications Prior to Admission medications   Medication Sig Start Date End Date Taking? Authorizing Provider  albuterol (VENTOLIN HFA) 108 (90 Base) MCG/ACT inhaler Inhale 2 puffs into the lungs every 6 (six) hours as needed for wheezing or shortness of breath (Cough). 12/14/21   Theadora Rama Scales, PA-C  azithromycin (ZITHROMAX) 250 MG tablet Take 2 tablets (500 mg total) by mouth daily for 1 day, THEN 1 tablet (250 mg total) daily for 4 days. 12/14/21 12/18/21  Theadora Rama Scales, PA-C  buPROPion (WELLBUTRIN XL) 150 MG 24 hr tablet Take 150 mg by mouth daily.    [provider]  cetirizine (ZYRTEC ALLERGY) 10 MG tablet Take 1 tablet (10 mg total) by mouth at bedtime. 12/14/21 06/12/22  Theadora Rama Scales, PA-C  fluticasone (FLONASE) 50 MCG/ACT nasal spray Place 1 spray into both nostrils daily. Begin by using 2 sprays in each nare daily for 3 to 5  days, then decrease to 1 spray in each nare daily. 12/14/21   Theadora Rama Scales, PA-C  ibuprofen (ADVIL) 400 MG tablet Take 1 tablet (400 mg total) by mouth every 8 (eight) hours as needed for up to 30 doses. 12/14/21   Theadora Rama Scales, PA-C  sertraline (ZOLOFT) 100 MG tablet Take by mouth.    [provider]      Allergies    Patient has no known allergies.    Review of Systems   Review of Systems  Constitutional:  Negative for fever.  Respiratory:  Positive for chest tightness and shortness of breath. Negative for cough.   Gastrointestinal:  Negative for abdominal pain.  All other systems reviewed and are negative.   Physical Exam Updated Vital Signs BP (!) 143/105 (BP Location: Right Arm)   Pulse 82   Temp 98.1 F (36.7 C)   Resp (!) 22   Ht 1.803 m (5\' 11" )   Wt 113.4 kg   SpO2 100%   BMI 34.87 kg/m  Physical Exam Vitals and nursing note reviewed.  Constitutional:      Appearance: He is well-developed. He is obese. He is not ill-appearing.  HENT:     Head: Normocephalic and atraumatic.  Eyes:     Pupils: Pupils are equal, round, and reactive to light.  Cardiovascular:     Rate and Rhythm: Normal rate and regular rhythm.  Heart sounds: Normal heart sounds. No murmur heard. Pulmonary:     Effort: Pulmonary effort is normal. No respiratory distress.     Breath sounds: Normal breath sounds. No wheezing.     Comments: Generally diminished breath sounds in all lung fields, fair air movement, no wheezing noted Abdominal:     General: Bowel sounds are normal.     Palpations: Abdomen is soft.     Tenderness: There is no abdominal tenderness. There is no rebound.  Musculoskeletal:     Cervical back: Neck supple.  Lymphadenopathy:     Cervical: No cervical adenopathy.  Skin:    General: Skin is warm and dry.  Neurological:     Mental Status: He is alert and oriented to person, place, and time.  Psychiatric:        Mood and Affect: Mood normal.      ED Results / Procedures / Treatments   Labs (all labs ordered are listed, but only abnormal results are displayed) Labs Reviewed - No data to display  EKG None  Radiology No results found.  Procedures Procedures    Medications Ordered in ED Medications  albuterol (VENTOLIN HFA) 108 (90 Base) MCG/ACT inhaler 2 puff (has no administration in time range)  ipratropium-albuterol (DUONEB) 0.5-2.5 (3) MG/3ML nebulizer solution 3 mL (3 mLs Nebulization Given 12/16/21 2306)  albuterol (PROVENTIL) (2.5 MG/3ML) 0.083% nebulizer solution 2.5 mg (2.5 mg Nebulization Given 12/16/21 2306)  AeroChamber Plus Flo-Vu Large MISC 1 each (1 each Other Given 12/17/21 0040)  dexamethasone (DECADRON) tablet 10 mg (10 mg Oral Given 12/17/21 0156)    ED Course/ Medical Decision Making/ A&P                           Medical Decision Making Risk Prescription drug management.   This patient presents to the ED for concern of shortness of breath, this involves an extensive number of treatment options, and is a complaint that carries with it a high risk of complications and morbidity.  I considered the following differential and admission for this acute, potentially life threatening condition.  The differential diagnosis includes asthma, bronchitis, pneumonia, viral etiology such as COVID or influenza  MDM:    This is a 31 year old male who presents with shortness of breath.  He is nontoxic and vital signs are reassuring.  He is in no respiratory distress.  He is satting 100%.  Patient reports significant improvement of symptoms after DuoNeb administered by respiratory therapy.  On my evaluation he has clear breath sounds.  I have reviewed his chart.  He is currently on Flonase, azithromycin, albuterol.  He received a dose of Decadron on 10/20.  We will administer a second dose of Decadron for continued airway inflammation given notes by respiratory therapy.  We discussed utility of possible x-ray versus  viral testing.  I do not feel this will likely change the patient's course of treatment as he is already on appropriate antibiotics that would cover for pneumonia and he is afebrile.  Additionally, supportive measures for any viral illness.  He was provided with a spacer for his inhaler.  I recommended that he use his inhaler scheduled over the next 1 to 2 days.  (Labs, imaging, consults)  Labs: I Ordered, and personally interpreted labs.  The pertinent results include: None  Imaging Studies ordered: I ordered imaging studies including none I independently visualized and interpreted imaging. I agree with the radiologist interpretation  Additional history obtained  from chart review.  External records from outside source obtained and reviewed including prior evaluations  Cardiac Monitoring: The patient was maintained on a cardiac monitor.  I personally viewed and interpreted the cardiac monitored which showed an underlying rhythm of: Sinus rhythm  Reevaluation: After the interventions noted above, I reevaluated the patient and found that they have :resolved  Social Determinants of Health: Lives independently  Disposition: Discharge  Co morbidities that complicate the patient evaluation  Past Medical History:  Diagnosis Date   ADHD (attention deficit hyperactivity disorder)    Anxiety    Asthma    Depression    Headache    hx of migraines    MRSA infection    hx of in 2006 and 2009    PONV (postoperative nausea and vomiting)    Ureterolithiasis      Medicines Meds ordered this encounter  Medications   albuterol (VENTOLIN HFA) 108 (90 Base) MCG/ACT inhaler 2 puff   ipratropium-albuterol (DUONEB) 0.5-2.5 (3) MG/3ML nebulizer solution 3 mL   albuterol (PROVENTIL) (2.5 MG/3ML) 0.083% nebulizer solution 2.5 mg   AeroChamber Plus Flo-Vu Large MISC 1 each   dexamethasone (DECADRON) tablet 10 mg    I have reviewed the patients home medicines and have made adjustments as  needed  Problem List / ED Course: Problem List Items Addressed This Visit   None Visit Diagnoses     SOB (shortness of breath)    -  Primary                   Final Clinical Impression(s) / ED Diagnoses Final diagnoses:  SOB (shortness of breath)    Rx / DC Orders ED Discharge Orders     None         Cheria Sadiq, Mayer Masker, MD 12/17/21 [phone]

## 2021-12-17 NOTE — ED Notes (Signed)
RT educated pt on proper use of MDI w/spacer. Pt able to perform without difficulty. RT also suggested pt see a pulmonologist for PFT for evaluation of asthma/reactive airway symptoms. Pt verbalizes understanding of teaching.

## 2022-01-02 ENCOUNTER — Ambulatory Visit (INDEPENDENT_AMBULATORY_CARE_PROVIDER_SITE_OTHER): Payer: BC Managed Care – PPO | Admitting: Pulmonary Disease

## 2022-01-02 ENCOUNTER — Encounter: Payer: Self-pay | Admitting: Pulmonary Disease

## 2022-01-02 VITALS — BP 150/92 | HR 107 | Temp 98.5°F | Ht 71.0 in | Wt 251.4 lb

## 2022-01-02 DIAGNOSIS — Z23 Encounter for immunization: Secondary | ICD-10-CM | POA: Diagnosis not present

## 2022-01-02 DIAGNOSIS — J4520 Mild intermittent asthma, uncomplicated: Secondary | ICD-10-CM

## 2022-01-02 MED ORDER — ALBUTEROL SULFATE HFA 108 (90 BASE) MCG/ACT IN AERS: 2.0000 | INHALATION_SPRAY | Freq: Four times a day (QID) | RESPIRATORY_TRACT | 6 refills | Status: AC | PRN

## 2022-01-02 MED ORDER — ALBUTEROL SULFATE HFA 108 (90 BASE) MCG/ACT IN AERS: 2.0000 | INHALATION_SPRAY | Freq: Four times a day (QID) | RESPIRATORY_TRACT | 12 refills | Status: DC | PRN

## 2022-01-02 NOTE — Patient Instructions (Signed)
I will see you about 6 months from here  Use your albuterol as needed  If you are having to use albuterol more than once or twice a week, we need to initiate a steroid inhaler  Call with significant concerns  Avoid known triggers

## 2022-01-02 NOTE — Progress Notes (Signed)
Chris Proctor    [id number]    [birth year]  Primary Care Physician:Pharr, Zollie Beckers, MD  Referring Physician: Merri Brunette, MD [address] [place],  [place] [address]  Chief complaint:   Patient being seen for asthma  HPI:  Patient was recently treated for asthma exacerbation  Longstanding history of asthma but did grew out of it and the last time he used any medications consistently for asthma was over 10 years ago up until recent exacerbation did have a couple of exacerbations back-to-back  Required steroids required antibiotic treatment with recent exacerbation that took a while to resolve he was seen in the urgent care center and also emergency department, received a dose of Decadron and antibiotics  He does have allergies and this is a trigger for his asthma  Does not smoke  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   buPROPion (WELLBUTRIN XL) 150 MG 24 hr tablet Take 150 mg by mouth daily.   cetirizine (ZYRTEC ALLERGY) 10 MG tablet Take 1 tablet (10 mg total) by mouth at bedtime.   fluticasone (FLONASE) 50 MCG/ACT nasal spray Place 1 spray into both nostrils daily. Begin by using 2 sprays in each nare daily for 3 to 5 days, then decrease to 1 spray in each nare daily.   ibuprofen (ADVIL) 400 MG tablet Take 1 tablet (400 mg total) by mouth every 8 (eight) hours as needed for up to 30 doses.   sertraline (ZOLOFT) 100 MG tablet Take by mouth.   No facility-administered encounter medications on file as of 01/02/2022.    Allergies as of 01/02/2022   (No Known Allergies)    Past Medical History:   Diagnosis Date   ADHD (attention deficit hyperactivity disorder)    Anxiety    Asthma    Depression    Headache    hx of migraines    MRSA infection    hx of in 2006 and 2009    PONV (postoperative nausea and vomiting)    Ureterolithiasis     Past Surgical History:  Procedure Laterality Date   CYSTOSCOPY WITH STENT PLACEMENT Right 06/20/2019   Procedure: CYSTOSCOPY WITH STENT PLACEMENT retrograde pylegram;  Surgeon: Rene Paci, MD;  Location: [facility];  Service: Urology;  Laterality: Right;   EXTRACORPOREAL SHOCK WAVE LITHOTRIPSY Right 06/24/2019   Procedure: EXTRACORPOREAL SHOCK WAVE LITHOTRIPSY (ESWL);  Surgeon: Malen Gauze, MD;  Location: [facility];  Service: Urology;  Laterality: Right;   INGUINAL HERNIA REPAIR Left 12/05/2014   Procedure: LEFT INGUINAL HERNIA REPAIR WITH  MESH;  Surgeon: Darnell Level, MD;  Location: [facility];  Service: General;  Laterality: Left;   INSERTION OF MESH Left 12/05/2014   Procedure: INSERTION OF MESH;  Surgeon: Darnell Level, MD;  Location: [facility];  Service: General;  Laterality: Left;   KNEE ARTHROSCOPY WITH LATERAL MENISECTOMY Right 07/29/2018   Procedure: RIGHT KNEE ARTHROSCOPY;  Surgeon: Bjorn Pippin, MD;  Location: [facility];  Service: Orthopedics;  Laterality: Right;   MOUTH SURGERY      Family History  Problem Relation Age of Onset   Hyperlipidemia Mother     Social History   Socioeconomic History   Marital status: Single    Spouse name: Not on file   Number of children: Not on file   Years of education: Not on file   Highest education level: Not on file  Occupational History   Not on file  Tobacco Use   Smoking status: Never   Smokeless tobacco: Never  Vaping Use   Vaping Use: Never used  Substance and Sexual Activity   Alcohol use: Yes    Alcohol/week: 2.0 standard drinks of alcohol    Types: 2 Cans of beer per week    Comment: 1-2 x per week or beer on weekend   Drug use:  No   Sexual activity: Not on file  Other Topics Concern   Not on file  Social History Narrative   Not on file   Social Determinants of Health   Financial Resource Strain: Not on file  Food Insecurity: Not on file  Transportation Needs: Not on file  Physical Activity: Not on file  Stress: Not on file  Social Connections: Not on file  Intimate Partner Violence: Not on file    Review of Systems  Respiratory:  Positive for shortness of breath.    Vitals:   01/02/22 1408  BP: (!) 150/92  Pulse: (!) 107  Temp: 98.5 F (36.9 C)  SpO2: 99%    Physical Exam Constitutional:      Appearance: He is obese.  HENT:     Head: Normocephalic.     Mouth/Throat:     Mouth: Mucous membranes are moist.  Eyes:     Conjunctiva/sclera: Conjunctivae normal.  Cardiovascular:     Rate and Rhythm: Normal rate and regular rhythm.     Heart sounds: No murmur heard.    No friction rub.  Pulmonary:     Effort: No respiratory distress.     Breath sounds: No stridor. No wheezing or rhonchi.  Musculoskeletal:     Cervical back: No rigidity or tenderness.  Neurological:     Mental Status: He is alert.  Psychiatric:        Mood and Affect: Mood normal.    Data Reviewed: Recent ED visits reviewed  Assessment:  History of asthma with recent exacerbation of symptoms  Does not appear to be needing albuterol very frequently  Recent exacerbation is probably due a URI infection  Plan/Recommendations: Avoid known triggers  Albuterol to be used as needed  If frequent use of albuterol, can consider a steroid inhaler  Encouraged to call with significant concerns  Tentative follow-up in about 6 months   Virl Diamond MD [facility] 01/02/2022, 2:16 PM  CC: Merri Brunette, MD

## 2022-01-18 ENCOUNTER — Other Ambulatory Visit: Payer: Self-pay

## 2022-01-18 ENCOUNTER — Emergency Department (HOSPITAL_COMMUNITY)
Admission: EM | Admit: 2022-01-18 | Discharge: 2022-01-18 | Disposition: A | Discharge status: Home / Self Care | Payer: BC Managed Care – PPO | Attending: Emergency Medicine | Admitting: Emergency Medicine

## 2022-01-18 ENCOUNTER — Emergency Department (HOSPITAL_COMMUNITY): Payer: BC Managed Care – PPO

## 2022-01-18 DIAGNOSIS — R000 Tachycardia, unspecified: Secondary | ICD-10-CM | POA: Insufficient documentation

## 2022-01-18 DIAGNOSIS — R109 Unspecified abdominal pain: Secondary | ICD-10-CM

## 2022-01-18 DIAGNOSIS — N200 Calculus of kidney: Secondary | ICD-10-CM | POA: Diagnosis not present

## 2022-01-18 DIAGNOSIS — Z7951 Long term (current) use of inhaled steroids: Secondary | ICD-10-CM | POA: Diagnosis not present

## 2022-01-18 DIAGNOSIS — K760 Fatty (change of) liver, not elsewhere classified: Secondary | ICD-10-CM | POA: Diagnosis not present

## 2022-01-18 DIAGNOSIS — N132 Hydronephrosis with renal and ureteral calculous obstruction: Secondary | ICD-10-CM | POA: Insufficient documentation

## 2022-01-18 DIAGNOSIS — R1031 Right lower quadrant pain: Secondary | ICD-10-CM | POA: Diagnosis not present

## 2022-01-18 DIAGNOSIS — R1084 Generalized abdominal pain: Secondary | ICD-10-CM | POA: Diagnosis not present

## 2022-01-18 DIAGNOSIS — J45909 Unspecified asthma, uncomplicated: Secondary | ICD-10-CM | POA: Diagnosis not present

## 2022-01-18 DIAGNOSIS — R161 Splenomegaly, not elsewhere classified: Secondary | ICD-10-CM | POA: Diagnosis not present

## 2022-01-18 LAB — BASIC METABOLIC PANEL
Anion gap: 6 (ref 5–15)
BUN: 16 mg/dL (ref 6–20)
CO2: 28 mmol/L (ref 22–32)
Calcium: 9.5 mg/dL (ref 8.9–10.3)
Chloride: 104 mmol/L (ref 98–111)
Creatinine, Ser: 0.86 mg/dL (ref 0.61–1.24)
GFR, Estimated: >60 mL/min (ref >60)
Glucose, Bld: 99 mg/dL (ref 70–99)
Potassium: 4.5 mmol/L (ref 3.5–5.1)
Sodium: 138 mmol/L (ref 135–145)

## 2022-01-18 LAB — URINALYSIS, ROUTINE W REFLEX MICROSCOPIC
Bilirubin Urine: NEGATIVE
Glucose, UA: NEGATIVE mg/dL
Ketones, ur: NEGATIVE mg/dL
Leukocytes,Ua: NEGATIVE
Nitrite: NEGATIVE
Protein, ur: NEGATIVE mg/dL
RBC / HPF: >50 RBC/hpf — ABNORMAL HIGH (ref 0–5)
Specific Gravity, Urine: 1.018 (ref 1.005–1.030)
pH: 5 (ref 5.0–8.0)

## 2022-01-18 LAB — CBC
HCT: 47.4 % (ref 39.0–52.0)
Hemoglobin: 16.1 g/dL (ref 13.0–17.0)
MCH: 28.7 pg (ref 26.0–34.0)
MCHC: 34 g/dL (ref 30.0–36.0)
MCV: 84.5 fL (ref 80.0–100.0)
Platelets: 220 10*3/uL (ref 150–400)
RBC: 5.61 MIL/uL (ref 4.22–5.81)
RDW: 14.4 % (ref 11.5–15.5)
WBC: 7.8 10*3/uL (ref 4.0–10.5)
nRBC: 0 % (ref 0.0–0.2)

## 2022-01-18 MED ORDER — MORPHINE SULFATE (PF) 4 MG/ML IV SOLN
4.0000 mg | Freq: Once | INTRAVENOUS | Status: AC
  Administered 2022-01-18: 4 mg via INTRAVENOUS
  Filled 2022-01-18: qty 1

## 2022-01-18 MED ORDER — TAMSULOSIN HCL 0.4 MG PO CAPS: 0.4000 mg | ORAL_CAPSULE | Freq: Every day | ORAL | 1 refills | Status: AC

## 2022-01-18 MED ORDER — HYDROCODONE-ACETAMINOPHEN 5-325 MG PO TABS: 1.0000 | ORAL_TABLET | ORAL | 0 refills | Status: DC | PRN

## 2022-01-18 MED ORDER — ONDANSETRON HCL 4 MG/2ML IJ SOLN
4.0000 mg | Freq: Once | INTRAMUSCULAR | Status: AC
  Administered 2022-01-18: 4 mg via INTRAVENOUS
  Filled 2022-01-18: qty 2

## 2022-01-18 MED ORDER — ONDANSETRON HCL 4 MG PO TABS: 4.0000 mg | ORAL_TABLET | Freq: Four times a day (QID) | ORAL | 0 refills | Status: AC

## 2022-01-18 MED ORDER — OXYCODONE-ACETAMINOPHEN 5-325 MG PO TABS: 1.0000 | ORAL_TABLET | Freq: Four times a day (QID) | ORAL | 0 refills | Status: DC | PRN

## 2022-01-18 MED ORDER — KETOROLAC TROMETHAMINE 15 MG/ML IJ SOLN
15.0000 mg | Freq: Once | INTRAMUSCULAR | Status: AC
  Administered 2022-01-18: 15 mg via INTRAVENOUS
  Filled 2022-01-18: qty 1

## 2022-01-18 NOTE — ED Triage Notes (Signed)
Pt BIBA from home. C/o R lower sided abd px beginning this AM. Pt has hx kidney stones. Pain comes in waves.  10 pain  Aox4  BP: 160/60 HR: 90 SPO2: 98 RA CBG: 105

## 2022-01-18 NOTE — Discharge Instructions (Signed)
You are seen in the emergency department today for flank pain.  You do have a 6 mm kidney stone in your right ureter.  I am prescribing you Flomax, Percocet and Zofran for helping with your symptoms until your kidney stone passes.  You have been prescribed a medication that is considered an opiate. Opiates are pain medications that should be used with caution. It is important that you do not drive while taking this medication as it can cause drowsiness and impaired reaction times. Do not mix this medication with benzodiazepine medications or alcohol as this can cause respiratory depression. Additionally, opiates have addicting properties to them. Please use medication as prescribed by your provider.  Please return to the emergency department if you begin to have fever, decreased urine output, obvious blood in your urine.  Please call [facility] today or over the weekend to set up a follow-up appointment.

## 2022-01-18 NOTE — ED Provider Notes (Signed)
[facility] Provider Note   CSN: [id number] Arrival date & time: 01/18/22  1026     History  Chief Complaint  Patient presents with   Abdominal Pain    [facility]. is a 31 y.o. male.  With past medical history of ureterolithiasis, asthma who presents to the emergency department with abdominal pain.  Patient states that at around 8 AM this morning he was awoken by having sharp, severe right-sided flank and abdominal pain.  He states that the pain comes in waves and is severe at its onset.  He denies having any associated nausea, vomiting or diarrhea.  He denies dysuria or decreased urine output or radiation of pain into his scrotum.  He denies any fevers or recent illnesses.  He does note that last week he had symptoms of kidney stones with hematuria which resolved.  He states that he was concerned because the pain was in his right abdomen "where my appendix is."  States that he does have a history of renal stones requiring stent placement and lithotripsy.  He is followed regularly by urologist.   Abdominal Pain Associated symptoms: no diarrhea, no dysuria, no fever, no hematuria, no nausea and no vomiting        Home Medications Prior to Admission medications   Medication Sig Start Date End Date Taking? Authorizing Provider  albuterol (VENTOLIN HFA) 108 (90 Base) MCG/ACT inhaler Inhale 2 puffs into the lungs every 6 (six) hours as needed for wheezing or shortness of breath (Cough). 01/02/22  Yes Olalere, Adewale A, MD  buPROPion (WELLBUTRIN XL) 150 MG 24 hr tablet Take 150 mg by mouth daily.   Yes [provider]  cetirizine (ZYRTEC ALLERGY) 10 MG tablet Take 1 tablet (10 mg total) by mouth at bedtime. Patient taking differently: Take 10 mg by mouth daily as needed for allergies or rhinitis. 12/14/21 06/12/22 Yes Theadora Rama Scales, PA-C  fluticasone (FLONASE) 50 MCG/ACT nasal spray Place 1 spray into both nostrils daily.  Begin by using 2 sprays in each nare daily for 3 to 5 days, then decrease to 1 spray in each nare daily. Patient taking differently: Place 1 spray into both nostrils daily as needed for allergies. 12/14/21  Yes Theadora Rama Scales, PA-C  ibuprofen (ADVIL) 400 MG tablet Take 1 tablet (400 mg total) by mouth every 8 (eight) hours as needed for up to 30 doses. 12/14/21  Yes Theadora Rama Scales, PA-C  Multiple Vitamins-Minerals (ALIVE MULTI-VITAMIN PO) Take 1 tablet by mouth daily.   Yes [provider]  ondansetron (ZOFRAN) 4 MG tablet Take 1 tablet (4 mg total) by mouth every 6 (six) hours. 01/18/22  Yes Cristopher Peru, PA-C  oxyCODONE-acetaminophen (PERCOCET/ROXICET) 5-325 MG tablet Take 1 tablet by mouth every 6 (six) hours as needed for up to 5 days for severe pain. 01/18/22 01/23/22 Yes Cristopher Peru, PA-C  sertraline (ZOLOFT) 100 MG tablet Take 100 mg by mouth daily.   Yes [provider]  tamsulosin (FLOMAX) 0.4 MG CAPS capsule Take 1 capsule (0.4 mg total) by mouth daily. 01/18/22   Cristopher Peru, PA-C      Allergies    Patient has no known allergies.    Review of Systems   Review of Systems  Constitutional:  Negative for fever.  Gastrointestinal:  Positive for abdominal pain. Negative for diarrhea, nausea and vomiting.  Genitourinary:  Positive for flank pain. Negative for dysuria, hematuria and testicular pain.  All other systems reviewed  and are negative.   Physical Exam Updated Vital Signs BP 121/86   Pulse 79   Temp (!) 97.3 F (36.3 C) (Oral)   Resp 13   Ht 5\' 11"  (1.803 m)   Wt 113.4 kg   SpO2 97%   BMI 34.87 kg/m  Physical Exam Vitals and nursing note reviewed.  Constitutional:      General: He is not in acute distress.    Appearance: Normal appearance. He is well-developed. He is obese. He is ill-appearing. He is not toxic-appearing.  HENT:     Head: Normocephalic.     Mouth/Throat:     Mouth: Mucous membranes are moist.  Eyes:      General: No scleral icterus.    Extraocular Movements: Extraocular movements intact.  Cardiovascular:     Rate and Rhythm: Regular rhythm. Tachycardia present.     Heart sounds: Normal heart sounds.  Pulmonary:     Effort: Pulmonary effort is normal. No respiratory distress.     Breath sounds: Normal breath sounds.  Abdominal:     General: Abdomen is protuberant. Bowel sounds are normal. There is no distension.     Palpations: Abdomen is soft.     Tenderness: There is abdominal tenderness in the right lower quadrant. There is right CVA tenderness and guarding. There is no left CVA tenderness or rebound.  Skin:    General: Skin is warm and dry.     Capillary Refill: Capillary refill takes less than 2 seconds.  Neurological:     General: No focal deficit present.     Mental Status: He is alert and oriented to person, place, and time.  Psychiatric:        Mood and Affect: Mood normal.        Behavior: Behavior normal.     ED Results / Procedures / Treatments   Labs (all labs ordered are listed, but only abnormal results are displayed) Labs Reviewed  URINALYSIS, ROUTINE W REFLEX MICROSCOPIC - Abnormal; Notable for the following components:      Result Value   Hgb urine dipstick LARGE (*)    RBC / HPF >50 (*)    Bacteria, UA RARE (*)    All other components within normal limits  URINE CULTURE  BASIC METABOLIC PANEL  CBC    EKG None  Radiology CT Renal Stone Study  Result Date: 01/18/2022 CLINICAL DATA:  31 year old male with abdominal and flank pain with hematuria. EXAM: CT ABDOMEN AND PELVIS WITHOUT CONTRAST TECHNIQUE: Multidetector CT imaging of the abdomen and pelvis was performed following the standard protocol without IV contrast. RADIATION DOSE REDUCTION: This exam was performed according to the departmental dose-optimization program which includes automated exposure control, adjustment of the mA and/or kV according to patient size and/or use of iterative reconstruction  technique. COMPARISON:  06/19/2019 and prior CTs FINDINGS: Please note that parenchymal and vascular abnormalities may be missed as intravenous contrast was not administered. Lower chest: A 3 mm RIGHT LOWER lobe nodule is unchanged since 2020 and considered benign. No other abnormalities identified. Hepatobiliary: Hepatic steatosis noted without focal hepatic abnormality. The gallbladder is unremarkable. There is no evidence of intrahepatic or extrahepatic biliary dilatation. Pancreas: Unremarkable Spleen: Splenomegaly again noted with a splenic volume of 700 cc. Adrenals/Urinary Tract: A 6 mm proximal RIGHT ureteral calculus causes mild to moderate RIGHT hydronephrosis. A punctate nonobstructing RIGHT UPPER pole renal calculus is identified. The adrenal glands, LEFT kidney and bladder are unremarkable. Stomach/Bowel: Stomach is within normal limits. Appendix appears  normal. No evidence of bowel wall thickening, distention, or inflammatory changes. Vascular/Lymphatic: No significant vascular findings are present. No enlarged abdominal or pelvic lymph nodes. Reproductive: Prostate is unremarkable. Other: No ascites, focal collection or pneumoperitoneum. Musculoskeletal: No acute or suspicious bony abnormalities are noted. IMPRESSION: 1. 6 mm proximal RIGHT ureteral calculus causing mild to moderate RIGHT hydronephrosis. 2. Punctate nonobstructing RIGHT UPPER pole renal calculus. 3. Hepatic steatosis. 4. Unchanged splenomegaly. Electronically Signed   By: Harmon PierJeffrey  Hu M.D.   On: 01/18/2022 11:49    Procedures Procedures    Medications Ordered in ED Medications  ketorolac (TORADOL) 15 MG/ML injection 15 mg (15 mg Intravenous Given 01/18/22 1053)  morphine (PF) 4 MG/ML injection 4 mg (4 mg Intravenous Given 01/18/22 1226)  ondansetron (ZOFRAN) injection 4 mg (4 mg Intravenous Given 01/18/22 1225)    ED Course/ Medical Decision Making/ A&P Clinical Course as of 01/18/22 1428  Fri Jan 18, 2022  1133 Pain  improved to 2/10 with toradol  [LA]  1223 Updated on CT results. Having new wave of pain. Will given morphine/zofran. Waiting UA for dispo [LA]    Clinical Course User Index [LA] Cristopher PeruAutry, Mirza Kidney E, PA-C                           Medical Decision Making Amount and/or Complexity of Data Reviewed Labs: ordered. Radiology: ordered.  Risk Prescription drug management.  Initial Impression and Ddx 31 year old male who presents to the emergency department with right sided flank pain that is intermittent. He is uncomfortable on initial exam, holding his right flank. Abdomen is soft and non-rigid. Symptoms concerning for renal stone. Other ddx: Acute hepatobiliary disease, pancreatitis, appendicitis, PUD, gastritis, SBO, diverticulitis, colitis, viral gastroenteritis, Crohn's, UC, vascular catastrophe, UTI, pyelonephritis, renal stone, obstructed stone, infected stone, testicular torsion, epididymitis, incarcerated hernia, STD, etc.   Ordering toradol, labs, ct renal stone study Patient PMH that increases complexity of ED encounter:  nephrolithiasis  Interpretation of Diagnostics I independent reviewed and interpreted the labs as followed: UA with blood, cbc, bmp negative, urine culture sent  - I independently visualized the following imaging with scope of interpretation limited to determining acute life threatening conditions related to emergency care: CT renal stone study, which revealed 6mm right ureteral stone  Patient Reassessment and Ultimate Disposition/Management On initial reassessment patient with mild pain control after toradol but remains uncomfortable. Will give morphine/zofran and reassess.  Labs without leukocytosis, or electrolyte derangement. Pending UA.  CT with 6mm right ureteral stone.  Reassessment with much improvement in pain symptoms.   UA with no UTI. Culture sent.   Symptoms inconsistent with other etiologies of abdominal/flank pain like appendicitis, pancreatitis,  acute hepatobiliary disease, PUD, SBO, diverticulitis, UC, vascular catastrophe, pyelonephritis, torsion, epididymitis, infected stone.  Dx: ureterolithiasis Will discharge with percocet, tamsulosin and zofran. He is already a patient of [facility] so will have him follow-up with them today or Monday. No indication for admission or antibiotics at this time. Return precautions given. He is agreeable to this plan.  Patient management required discussion with the following services or consulting groups:  None  Complexity of Problems Addressed Acute complicated illness or Injury  Additional Data Reviewed and Analyzed Further history obtained from: Prior ED visit notes, Recent PCP notes, Prior labs/imaging results, and Records from care facility  Patient Encounter Risk Assessment Prescriptions and Use of parenteral controlled substances  Final Clinical Impression(s) / ED Diagnoses Final diagnoses:  Flank pain  Rx / DC Orders ED Discharge Orders          Ordered    tamsulosin (FLOMAX) 0.4 MG CAPS capsule  Daily        01/18/22 1426    ondansetron (ZOFRAN) 4 MG tablet  Every 6 hours        01/18/22 1426    oxyCODONE-acetaminophen (PERCOCET/ROXICET) 5-325 MG tablet  Every 6 hours PRN        01/18/22 1426              Cristopher Peru, PA-C 01/18/22 1446    Derwood Kaplan, MD 01/19/22 1533

## 2022-01-19 ENCOUNTER — Other Ambulatory Visit: Payer: Self-pay

## 2022-01-19 ENCOUNTER — Emergency Department (HOSPITAL_COMMUNITY)
Admission: EM | Admit: 2022-01-19 | Discharge: 2022-01-20 | Disposition: A | Discharge status: Home / Self Care | Payer: BC Managed Care – PPO | Attending: Emergency Medicine | Admitting: Emergency Medicine

## 2022-01-19 ENCOUNTER — Encounter (HOSPITAL_COMMUNITY): Payer: Self-pay

## 2022-01-19 DIAGNOSIS — N201 Calculus of ureter: Secondary | ICD-10-CM | POA: Diagnosis not present

## 2022-01-19 DIAGNOSIS — N132 Hydronephrosis with renal and ureteral calculous obstruction: Secondary | ICD-10-CM | POA: Insufficient documentation

## 2022-01-19 DIAGNOSIS — D72829 Elevated white blood cell count, unspecified: Secondary | ICD-10-CM | POA: Diagnosis not present

## 2022-01-19 DIAGNOSIS — N200 Calculus of kidney: Secondary | ICD-10-CM | POA: Diagnosis not present

## 2022-01-19 DIAGNOSIS — R109 Unspecified abdominal pain: Secondary | ICD-10-CM | POA: Diagnosis not present

## 2022-01-19 LAB — URINE CULTURE: Culture: 1000 — AB

## 2022-01-19 NOTE — ED Triage Notes (Signed)
Pt reports with right flank pain, pt was dx with a kidney stone yesterday and his pain is not controlled with the pain meds given.

## 2022-01-20 ENCOUNTER — Emergency Department (HOSPITAL_COMMUNITY): Payer: BC Managed Care – PPO

## 2022-01-20 ENCOUNTER — Telehealth (HOSPITAL_BASED_OUTPATIENT_CLINIC_OR_DEPARTMENT_OTHER): Payer: Self-pay | Admitting: *Deleted

## 2022-01-20 DIAGNOSIS — N200 Calculus of kidney: Secondary | ICD-10-CM | POA: Diagnosis not present

## 2022-01-20 DIAGNOSIS — N201 Calculus of ureter: Secondary | ICD-10-CM | POA: Diagnosis not present

## 2022-01-20 LAB — CBC WITH DIFFERENTIAL/PLATELET
Abs Immature Granulocytes: 0.05 10*3/uL (ref 0.00–0.07)
Basophils Absolute: 0.1 10*3/uL (ref 0.0–0.1)
Basophils Relative: 1 %
Eosinophils Absolute: 0.3 10*3/uL (ref 0.0–0.5)
Eosinophils Relative: 3 %
HCT: 43.1 % (ref 39.0–52.0)
Hemoglobin: 14.7 g/dL (ref 13.0–17.0)
Immature Granulocytes: 1 %
Lymphocytes Relative: 22 %
Lymphs Abs: 2 10*3/uL (ref 0.7–4.0)
MCH: 29 pg (ref 26.0–34.0)
MCHC: 34.1 g/dL (ref 30.0–36.0)
MCV: 85 fL (ref 80.0–100.0)
Monocytes Absolute: 0.8 10*3/uL (ref 0.1–1.0)
Monocytes Relative: 9 %
Neutro Abs: 6 10*3/uL (ref 1.7–7.7)
Neutrophils Relative %: 64 %
Platelets: 205 10*3/uL (ref 150–400)
RBC: 5.07 MIL/uL (ref 4.22–5.81)
RDW: 14.4 % (ref 11.5–15.5)
WBC: 9.2 10*3/uL (ref 4.0–10.5)
nRBC: 0 % (ref 0.0–0.2)

## 2022-01-20 LAB — I-STAT CHEM 8, ED
BUN: 21 mg/dL — ABNORMAL HIGH (ref 6–20)
Calcium, Ion: 1.19 mmol/L (ref 1.15–1.40)
Chloride: 102 mmol/L (ref 98–111)
Creatinine, Ser: 1.4 mg/dL — ABNORMAL HIGH (ref 0.61–1.24)
Glucose, Bld: 85 mg/dL (ref 70–99)
HCT: 44 % (ref 39.0–52.0)
Hemoglobin: 15 g/dL (ref 13.0–17.0)
Potassium: 4.4 mmol/L (ref 3.5–5.1)
Sodium: 139 mmol/L (ref 135–145)
TCO2: 28 mmol/L (ref 22–32)

## 2022-01-20 MED ORDER — MAGNESIUM SULFATE 2 GM/50ML IV SOLN
2.0000 g | Freq: Once | INTRAVENOUS | Status: AC
  Administered 2022-01-20: 2 g via INTRAVENOUS
  Filled 2022-01-20: qty 50

## 2022-01-20 MED ORDER — SODIUM CHLORIDE 0.9 % IV BOLUS: 500.0000 mL | Freq: Once | INTRAVENOUS | Status: DC

## 2022-01-20 MED ORDER — SODIUM CHLORIDE 0.9 % IV SOLN: INTRAVENOUS | Status: DC

## 2022-01-20 MED ORDER — HYDROCODONE-ACETAMINOPHEN 5-325 MG PO TABS: 1.0000 | ORAL_TABLET | Freq: Four times a day (QID) | ORAL | 0 refills | Status: DC | PRN

## 2022-01-20 MED ORDER — KETOROLAC TROMETHAMINE 30 MG/ML IJ SOLN
30.0000 mg | Freq: Once | INTRAMUSCULAR | Status: AC
  Administered 2022-01-20: 30 mg via INTRAVENOUS
  Filled 2022-01-20: qty 1

## 2022-01-20 MED ORDER — DICLOFENAC SODIUM ER 100 MG PO TB24: 100.0000 mg | ORAL_TABLET | Freq: Every day | ORAL | 0 refills | Status: DC

## 2022-01-20 MED ORDER — HALOPERIDOL LACTATE 5 MG/ML IJ SOLN
2.0000 mg | Freq: Once | INTRAMUSCULAR | Status: AC
  Administered 2022-01-20: 2 mg via INTRAVENOUS
  Filled 2022-01-20: qty 1

## 2022-01-20 NOTE — Telephone Encounter (Signed)
Post ED Visit - Positive Culture Follow-up  Culture report reviewed by antimicrobial stewardship pharmacist: [facility] []  , Pharm.D. []  Enzo Bi, Pharm.D., BCPS AQ-ID []  , Pharm.D., BCPS []  Celedonio Miyamoto, Pharm.D., BCPS []  [place], Garvin Fila.D., BCPS, AAHIVP []  , Pharm.D., BCPS, AAHIVP []  Georgina Pillion, PharmD, BCPS []  , PharmD, BCPS []  [place], PharmD, BCPS []  [address], PharmD []  , PharmD, BCPS []  Estella Husk, PharmD  Pharmacy Team []  Lysle Pearl, PharmD []  , PharmD []  Phillips Climes, PharmD []  , Rph []  Agapito Games) , PharmD []  Verlan Friends, PharmD []  , PharmD []  Mervyn Gay, PharmD []  , PharmD []  Vinnie Level, PharmD []  [facility], PharmD []  , PharmD [x]  Len Childs, PharmD   Positive urine culture No further patient follow-up is required at this time.  01/20/2022, 8:48 AM

## 2022-01-20 NOTE — ED Provider Notes (Signed)
[facility]-EMERGENCY DEPT Provider Note   CSN: [id number] Arrival date & time: 01/19/22  2334     History  Chief Complaint  Patient presents with   Flank Pain    Chris Proctor [facility]. is a 31 y.o. male.  The history is provided by the patient.  Flank Pain This is a recurrent problem. The current episode started 3 to 5 hours ago. The problem occurs constantly. The problem has not changed since onset.Nothing aggravates the symptoms. Nothing relieves the symptoms. Treatments tried: home pain medication and flomax. The treatment provided no relief.  Patient with kidney stone diagnosed 36 hours ago presents with pain not managed on home pain medication. Patient called on call urology who told him to come to the ED to be seen.       Home Medications Prior to Admission medications   Medication Sig Start Date End Date Taking? Authorizing Provider  albuterol (VENTOLIN HFA) 108 (90 Base) MCG/ACT inhaler Inhale 2 puffs into the lungs every 6 (six) hours as needed for wheezing or shortness of breath (Cough). 01/02/22  Yes Olalere, Adewale A, MD  buPROPion (WELLBUTRIN XL) 150 MG 24 hr tablet Take 150 mg by mouth daily.   Yes [provider]  Diclofenac Sodium CR 100 MG 24 hr tablet Take 1 tablet (100 mg total) by mouth daily. 01/20/22  Yes Cabrini Ruggieri, MD  fluticasone [facility]) 50 MCG/ACT nasal spray Place 1 spray into both nostrils daily. Begin by using 2 sprays in each nare daily for 3 to 5 days, then decrease to 1 spray in each nare daily. Patient taking differently: Place 1 spray into both nostrils daily as needed for allergies. 12/14/21  Yes Theadora Rama Scales, PA-C  HYDROcodone-acetaminophen (NORCO/VICODIN) 5-325 MG tablet Take 1 tablet by mouth every 4 (four) hours as needed. Patient taking differently: Take 1 tablet by mouth every 4 (four) hours as needed for moderate pain or severe pain. 01/18/22  Yes Cristopher Peru, PA-C  Multiple  Vitamins-Minerals (ALIVE MULTI-VITAMIN PO) Take 1 tablet by mouth daily.   Yes [provider]  ondansetron (ZOFRAN) 4 MG tablet Take 1 tablet (4 mg total) by mouth every 6 (six) hours. Patient taking differently: Take 4 mg by mouth every 6 (six) hours as needed for nausea or vomiting. 01/18/22  Yes Cristopher Peru, PA-C  sertraline (ZOLOFT) 100 MG tablet Take 100 mg by mouth daily.   Yes [provider]  tamsulosin (FLOMAX) 0.4 MG CAPS capsule Take 1 capsule (0.4 mg total) by mouth daily. 01/18/22  Yes Cristopher Peru, PA-C  cetirizine (ZYRTEC ALLERGY) 10 MG tablet Take 1 tablet (10 mg total) by mouth at bedtime. Patient not taking: Reported on 01/20/2022 12/14/21 06/12/22  Theadora Rama Scales, PA-C  ibuprofen (ADVIL) 400 MG tablet Take 1 tablet (400 mg total) by mouth every 8 (eight) hours as needed for up to 30 doses. Patient not taking: Reported on 01/20/2022 12/14/21   Theadora Rama Scales, PA-C      Allergies    Patient has no known allergies.    Review of Systems   Review of Systems  Constitutional:  Negative for fever.  HENT:  Negative for facial swelling.   Eyes:  Negative for redness.  Cardiovascular:  Negative for leg swelling.  Gastrointestinal:  Negative for vomiting.  Genitourinary:  Positive for flank pain.  All other systems reviewed and are negative.   Physical Exam Updated Vital Signs BP 139/73   Pulse 80   Temp  97.6 F (36.4 C) (Oral)   Resp 16   Ht 5\' 11"  (1.803 m)   Wt 113 kg   SpO2 97%   BMI 34.75 kg/m  Physical Exam Vitals and nursing note reviewed.  Constitutional:      General: He is not in acute distress.    Appearance: Normal appearance. He is well-developed. He is not diaphoretic.  HENT:     Head: Normocephalic and atraumatic.     Nose: Nose normal.  Eyes:     Conjunctiva/sclera: Conjunctivae normal.     Pupils: Pupils are equal, round, and reactive to light.  Cardiovascular:     Rate and Rhythm: Normal rate and  regular rhythm.  Pulmonary:     Effort: Pulmonary effort is normal.     Breath sounds: Normal breath sounds. No wheezing or rales.  Abdominal:     General: Bowel sounds are normal.     Palpations: Abdomen is soft.     Tenderness: There is no abdominal tenderness. There is no guarding or rebound.  Musculoskeletal:        General: Normal range of motion.     Cervical back: Normal range of motion and neck supple.  Skin:    General: Skin is warm and dry.     Capillary Refill: Capillary refill takes less than 2 seconds.  Neurological:     General: No focal deficit present.     Mental Status: He is alert and oriented to person, place, and time.  Psychiatric:        Mood and Affect: Mood normal.     ED Results / Procedures / Treatments   Labs (all labs ordered are listed, but only abnormal results are displayed) Results for orders placed or performed during the hospital encounter of 01/19/22  CBC with Differential  Result Value Ref Range   WBC 9.2 4.0 - 10.5 K/uL   RBC 5.07 4.22 - 5.81 MIL/uL   Hemoglobin 14.7 13.0 - 17.0 g/dL   HCT 01/21/22 16.5 - 53.7 %   MCV 85.0 80.0 - 100.0 fL   MCH 29.0 26.0 - 34.0 pg   MCHC 34.1 30.0 - 36.0 g/dL   RDW 48.2 70.7 - 86.7 %   Platelets 205 150 - 400 K/uL   nRBC 0.0 0.0 - 0.2 %   Neutrophils Relative % 64 %   Neutro Abs 6.0 1.7 - 7.7 K/uL   Lymphocytes Relative 22 %   Lymphs Abs 2.0 0.7 - 4.0 K/uL   Monocytes Relative 9 %   Monocytes Absolute 0.8 0.1 - 1.0 K/uL   Eosinophils Relative 3 %   Eosinophils Absolute 0.3 0.0 - 0.5 K/uL   Basophils Relative 1 %   Basophils Absolute 0.1 0.0 - 0.1 K/uL   Immature Granulocytes 1 %   Abs Immature Granulocytes 0.05 0.00 - 0.07 K/uL  I-stat chem 8, ED (not at [facility] or [facility])  Result Value Ref Range   Sodium 139 135 - 145 mmol/L   Potassium 4.4 3.5 - 5.1 mmol/L   Chloride 102 98 - 111 mmol/L   BUN 21 (H) 6 - 20 mg/dL   Creatinine, Ser [facility] (H) 0.61 - 1.24 mg/dL   Glucose, Bld 85 70 - 99 mg/dL    Calcium, Ion 9.20 1.00 - 1.40 mmol/L   TCO2 28 22 - 32 mmol/L   Hemoglobin 15.0 13.0 - 17.0 g/dL   HCT 7.12 19.7 - 58.8 %   DG Abdomen 1 View  Result Date: 01/20/2022 CLINICAL DATA:  History of right-sided renal stones with abdominal pain. EXAM: ABDOMEN - 1 VIEW COMPARISON:  February 02, 2021 FINDINGS: The bowel gas pattern is normal. A 5 mm soft tissue calcification is seen projecting over the medial aspect of the mid right abdomen. This is seen adjacent to the inferior aspect of the L3 vertebral body on the right. This represents a new finding. The renal calculus seen overlying the lower pole of the right kidney on the prior study is not visualized on the current exam. IMPRESSION: 5 mm right ureteral calculus. Electronically Signed   By: Aram Candela M.D.   On: 01/20/2022 02:18   CT Renal Stone Study  Result Date: 01/18/2022 CLINICAL DATA:  31 year old male with abdominal and flank pain with hematuria. EXAM: CT ABDOMEN AND PELVIS WITHOUT CONTRAST TECHNIQUE: Multidetector CT imaging of the abdomen and pelvis was performed following the standard protocol without IV contrast. RADIATION DOSE REDUCTION: This exam was performed according to the departmental dose-optimization program which includes automated exposure control, adjustment of the mA and/or kV according to patient size and/or use of iterative reconstruction technique. COMPARISON:  06/19/2019 and prior CTs FINDINGS: Please note that parenchymal and vascular abnormalities may be missed as intravenous contrast was not administered. Lower chest: A 3 mm RIGHT LOWER lobe nodule is unchanged since 2020 and considered benign. No other abnormalities identified. Hepatobiliary: Hepatic steatosis noted without focal hepatic abnormality. The gallbladder is unremarkable. There is no evidence of intrahepatic or extrahepatic biliary dilatation. Pancreas: Unremarkable Spleen: Splenomegaly again noted with a splenic volume of 700 cc. Adrenals/Urinary Tract: A  6 mm proximal RIGHT ureteral calculus causes mild to moderate RIGHT hydronephrosis. A punctate nonobstructing RIGHT UPPER pole renal calculus is identified. The adrenal glands, LEFT kidney and bladder are unremarkable. Stomach/Bowel: Stomach is within normal limits. Appendix appears normal. No evidence of bowel wall thickening, distention, or inflammatory changes. Vascular/Lymphatic: No significant vascular findings are present. No enlarged abdominal or pelvic lymph nodes. Reproductive: Prostate is unremarkable. Other: No ascites, focal collection or pneumoperitoneum. Musculoskeletal: No acute or suspicious bony abnormalities are noted. IMPRESSION: 1. 6 mm proximal RIGHT ureteral calculus causing mild to moderate RIGHT hydronephrosis. 2. Punctate nonobstructing RIGHT UPPER pole renal calculus. 3. Hepatic steatosis. 4. Unchanged splenomegaly. Electronically Signed   By: Harmon Pier M.D.   On: 01/18/2022 11:49    Radiology DG Abdomen 1 View  Result Date: 01/20/2022 CLINICAL DATA:  History of right-sided renal stones with abdominal pain. EXAM: ABDOMEN - 1 VIEW COMPARISON:  February 02, 2021 FINDINGS: The bowel gas pattern is normal. A 5 mm soft tissue calcification is seen projecting over the medial aspect of the mid right abdomen. This is seen adjacent to the inferior aspect of the L3 vertebral body on the right. This represents a new finding. The renal calculus seen overlying the lower pole of the right kidney on the prior study is not visualized on the current exam. IMPRESSION: 5 mm right ureteral calculus. Electronically Signed   By: Aram Candela M.D.   On: 01/20/2022 02:18   CT Renal Stone Study  Result Date: 01/18/2022 CLINICAL DATA:  31 year old male with abdominal and flank pain with hematuria. EXAM: CT ABDOMEN AND PELVIS WITHOUT CONTRAST TECHNIQUE: Multidetector CT imaging of the abdomen and pelvis was performed following the standard protocol without IV contrast. RADIATION DOSE REDUCTION: This  exam was performed according to the departmental dose-optimization program which includes automated exposure control, adjustment of the mA and/or kV according to patient size and/or use of iterative reconstruction technique.  COMPARISON:  06/19/2019 and prior CTs FINDINGS: Please note that parenchymal and vascular abnormalities may be missed as intravenous contrast was not administered. Lower chest: A 3 mm RIGHT LOWER lobe nodule is unchanged since 2020 and considered benign. No other abnormalities identified. Hepatobiliary: Hepatic steatosis noted without focal hepatic abnormality. The gallbladder is unremarkable. There is no evidence of intrahepatic or extrahepatic biliary dilatation. Pancreas: Unremarkable Spleen: Splenomegaly again noted with a splenic volume of 700 cc. Adrenals/Urinary Tract: A 6 mm proximal RIGHT ureteral calculus causes mild to moderate RIGHT hydronephrosis. A punctate nonobstructing RIGHT UPPER pole renal calculus is identified. The adrenal glands, LEFT kidney and bladder are unremarkable. Stomach/Bowel: Stomach is within normal limits. Appendix appears normal. No evidence of bowel wall thickening, distention, or inflammatory changes. Vascular/Lymphatic: No significant vascular findings are present. No enlarged abdominal or pelvic lymph nodes. Reproductive: Prostate is unremarkable. Other: No ascites, focal collection or pneumoperitoneum. Musculoskeletal: No acute or suspicious bony abnormalities are noted. IMPRESSION: 1. 6 mm proximal RIGHT ureteral calculus causing mild to moderate RIGHT hydronephrosis. 2. Punctate nonobstructing RIGHT UPPER pole renal calculus. 3. Hepatic steatosis. 4. Unchanged splenomegaly. Electronically Signed   By: Harmon PierJeffrey  Hu M.D.   On: 01/18/2022 11:49    Procedures Procedures    Medications Ordered in ED Medications  0.9 %  sodium chloride infusion ( Intravenous New Bag/Given 01/20/22 0209)  sodium chloride 0.9 % bolus 500 mL (has no administration in time  range)  magnesium sulfate IVPB 2 g 50 mL (0 g Intravenous Stopped 01/20/22 0302)  haloperidol lactate (HALDOL) injection 2 mg (2 mg Intravenous Given 01/20/22 0207)  ketorolac (TORADOL) 30 MG/ML injection 30 mg (30 mg Intravenous Given 01/20/22 0321)    ED Course/ Medical Decision Making/ A&P                           Medical Decision Making Patient with flank pain not responsive to home medications   Amount and/or Complexity of Data Reviewed External Data Reviewed: labs, radiology and notes.    Details: Previous ED notes and CT reviewed  Labs: ordered.    Details: All labs reviewed:  white count normal 9.2, normal hemoglobin 14.7, normal platelet count.  Normal sodium 139, normal potassium 4.4, creatinine 1.4  Radiology: ordered.    Details: Stone in the ureter on XR  Risk Prescription drug management. Risk Details: Sleeping in room post medication.  No emesis.  Stable for discharge.  Contact urology for close follow up.  Will add voltaren XR to regimen to improve pain at home.  Strict return.      Final Clinical Impression(s) / ED Diagnoses Final diagnoses:  Nephrolithiasis   Return for intractable cough, coughing up blood, fevers > 100.4 unrelieved by medication, shortness of breath, intractable vomiting, chest pain, shortness of breath, weakness, numbness, changes in speech, facial asymmetry, abdominal pain, passing out, Inability to tolerate liquids or food, cough, altered mental status or any concerns. No signs of systemic illness or infection. The patient is nontoxic-appearing on exam and vital signs are within normal limits.  I have reviewed the triage vital signs and the nursing notes. Pertinent labs & imaging results that were available during my care of the patient were reviewed by me and considered in my medical decision making (see chart for details). After history, exam, and medical workup I feel the patient has been appropriately medically screened and is safe for  discharge home. Pertinent diagnoses were discussed with the patient. Patient  was given return precautions.  Rx / DC Orders ED Discharge Orders          Ordered    Diclofenac Sodium CR 100 MG 24 hr tablet  Daily        01/20/22 0410              Damel Querry, MD 01/20/22 [id number]

## 2022-01-20 NOTE — ED Notes (Signed)
Pt discharged. Instructions and prescriptions given. AAOX4. Pt in no apparent distress with mild pain. The opportunity to ask questions was provided.  

## 2022-01-22 DIAGNOSIS — R8271 Bacteriuria: Secondary | ICD-10-CM | POA: Diagnosis not present

## 2022-01-22 DIAGNOSIS — N201 Calculus of ureter: Secondary | ICD-10-CM | POA: Diagnosis not present

## 2022-01-23 ENCOUNTER — Other Ambulatory Visit: Payer: Self-pay | Admitting: Urology

## 2022-01-25 ENCOUNTER — Encounter (HOSPITAL_BASED_OUTPATIENT_CLINIC_OR_DEPARTMENT_OTHER): Payer: Self-pay | Admitting: Urology

## 2022-01-25 NOTE — Progress Notes (Signed)
Pre-op phone call complete. Procedure date and arrival time confirmed. Patient allergies, medications, and medical history verified. Patient to stop Diclofenac and Ibuprofen within 48 hours of procedure. Patient to be NPO at midnight. Driver secured.

## 2022-01-28 ENCOUNTER — Ambulatory Visit (HOSPITAL_BASED_OUTPATIENT_CLINIC_OR_DEPARTMENT_OTHER)
Admission: RE | Admit: 2022-01-28 | Discharge: 2022-01-28 | Disposition: A | Discharge status: Home / Self Care | Payer: BC Managed Care – PPO | Attending: Urology | Admitting: Urology

## 2022-01-28 ENCOUNTER — Other Ambulatory Visit: Payer: Self-pay

## 2022-01-28 ENCOUNTER — Encounter (HOSPITAL_BASED_OUTPATIENT_CLINIC_OR_DEPARTMENT_OTHER): Payer: Self-pay | Admitting: Urology

## 2022-01-28 ENCOUNTER — Encounter (HOSPITAL_BASED_OUTPATIENT_CLINIC_OR_DEPARTMENT_OTHER)
Admission: RE | Disposition: A | Discharge status: Home / Self Care | Payer: Self-pay | Source: Home / Self Care | Attending: Urology

## 2022-01-28 ENCOUNTER — Ambulatory Visit (HOSPITAL_COMMUNITY): Payer: BC Managed Care – PPO

## 2022-01-28 DIAGNOSIS — J45909 Unspecified asthma, uncomplicated: Secondary | ICD-10-CM | POA: Insufficient documentation

## 2022-01-28 DIAGNOSIS — N201 Calculus of ureter: Secondary | ICD-10-CM | POA: Insufficient documentation

## 2022-01-28 DIAGNOSIS — Z01818 Encounter for other preprocedural examination: Secondary | ICD-10-CM | POA: Diagnosis not present

## 2022-01-28 HISTORY — DX: Personal history of urinary calculi: Z87.442

## 2022-01-28 HISTORY — PX: EXTRACORPOREAL SHOCK WAVE LITHOTRIPSY: SHX1557

## 2022-01-28 SURGERY — LITHOTRIPSY, ESWL
Anesthesia: LOCAL | Laterality: Right

## 2022-01-28 MED ORDER — CIPROFLOXACIN HCL 500 MG PO TABS
500.0000 mg | ORAL_TABLET | ORAL | Status: AC
  Administered 2022-01-28: 500 mg via ORAL

## 2022-01-28 MED ORDER — DIPHENHYDRAMINE HCL 25 MG PO CAPS
ORAL_CAPSULE | ORAL | Status: AC
  Filled 2022-01-28: qty 1

## 2022-01-28 MED ORDER — OXYCODONE HCL 5 MG PO TABS
ORAL_TABLET | ORAL | Status: AC
  Filled 2022-01-28: qty 1

## 2022-01-28 MED ORDER — DIAZEPAM 5 MG PO TABS
ORAL_TABLET | ORAL | Status: AC
  Filled 2022-01-28: qty 2

## 2022-01-28 MED ORDER — SODIUM CHLORIDE 0.9 % IV SOLN
INTRAVENOUS | Status: DC
  Administered 2022-01-28: 1000 mL via INTRAVENOUS

## 2022-01-28 MED ORDER — DIAZEPAM 5 MG PO TABS
10.0000 mg | ORAL_TABLET | ORAL | Status: AC
  Administered 2022-01-28: 10 mg via ORAL

## 2022-01-28 MED ORDER — DIPHENHYDRAMINE HCL 25 MG PO CAPS
25.0000 mg | ORAL_CAPSULE | ORAL | Status: AC
  Administered 2022-01-28: 25 mg via ORAL

## 2022-01-28 MED ORDER — OXYCODONE HCL 5 MG PO TABS
5.0000 mg | ORAL_TABLET | Freq: Once | ORAL | Status: AC
  Administered 2022-01-28: 5 mg via ORAL

## 2022-01-28 MED ORDER — CIPROFLOXACIN HCL 500 MG PO TABS
ORAL_TABLET | ORAL | Status: AC
  Filled 2022-01-28: qty 1

## 2022-01-28 NOTE — H&P (Signed)
Urology Preoperative H&P   Chief Complaint: Proximal right ureteral stone  History of Present Illness: Matisse Salais [facility]. is a 31 y.o. male with a proximal right ureteral stone measuring 34mm here for ESWL.    Past Medical History:  Diagnosis Date   ADHD (attention deficit hyperactivity disorder)    Anxiety    Asthma    Depression    Headache    hx of migraines    History of kidney stones    MRSA infection    hx of in 2006 and 2009    PONV (postoperative nausea and vomiting)    Ureterolithiasis     Past Surgical History:  Procedure Laterality Date   CYSTOSCOPY WITH STENT PLACEMENT Right 06/20/2019   Procedure: CYSTOSCOPY WITH STENT PLACEMENT retrograde pylegram;  Surgeon: Rene Paci, MD;  Location: [facility];  Service: Urology;  Laterality: Right;   EXTRACORPOREAL SHOCK WAVE LITHOTRIPSY Right 06/24/2019   Procedure: EXTRACORPOREAL SHOCK WAVE LITHOTRIPSY ([facility]);  Surgeon: Malen Gauze, MD;  Location: [facility];  Service: Urology;  Laterality: Right;   INGUINAL HERNIA REPAIR Left 12/05/2014   Procedure: LEFT INGUINAL HERNIA REPAIR WITH  MESH;  Surgeon: Darnell Level, MD;  Location: [facility];  Service: General;  Laterality: Left;   INSERTION OF MESH Left 12/05/2014   Procedure: INSERTION OF MESH;  Surgeon: Darnell Level, MD;  Location: [facility];  Service: General;  Laterality: Left;   KNEE ARTHROSCOPY WITH LATERAL MENISECTOMY Right 07/29/2018   Procedure: RIGHT KNEE ARTHROSCOPY;  Surgeon: Bjorn Pippin, MD;  Location: [facility];  Service: Orthopedics;  Laterality: Right;   MOUTH SURGERY      Allergies: No Known Allergies  Family History  Problem Relation Age of Onset   Hyperlipidemia Mother     Social History:  reports that he has never smoked. He has never used smokeless tobacco. He reports current alcohol use of about 2.0 standard drinks of alcohol per week. He reports that he does not use drugs.  ROS: A complete review of  systems was performed.  All systems are negative except for pertinent findings as noted.  Physical Exam:  Vital signs in last 24 hours: Temp:  [97.6 F (36.4 C)] 97.6 F (36.4 C) (12/04 0721) Pulse Rate:  [77] 77 (12/04 0721) Resp:  [18] 18 (12/04 0721) BP: (151)/(93) 151/93 (12/04 0721) SpO2:  [100 %] 100 % (12/04 0721) Weight:  [272 kg] 112 kg (12/04 0721) Constitutional:  Alert and oriented, No acute distress Cardiovascular: Regular rate and rhythm Respiratory: Normal respiratory effort, Lungs clear bilaterally GI: Abdomen is soft, nontender, nondistended, no abdominal masses GU: No CVA tenderness Lymphatic: No lymphadenopathy Neurologic: Grossly intact, no focal deficits Psychiatric: Normal mood and affect  Laboratory Data:  No results for input(s): "WBC", "HGB", "HCT", "PLT" in the last 72 hours.  No results for input(s): "NA", "K", "CL", "GLUCOSE", "BUN", "CALCIUM", "CREATININE" in the last 72 hours.  Invalid input(s): "CO3"   No results found for this or any previous visit (from the past 24 hour(s)). Recent Results (from the past 240 hour(s))  Urine Culture     Status: Abnormal   Collection Time: 01/18/22 12:55 PM   Specimen: Urine, Clean Catch  Result Value Ref Range Status   Specimen Description   Final    URINE, CLEAN CATCH Performed at [facility], [address] [place], [place] [address]    Special Requests   Final    NONE Performed at [facility], [address], [place], [place] [address]    Culture (A)  Final    1,000 COLONIES/mL GROUP B STREP(S.AGALACTIAE)ISOLATED TESTING AGAINST S. AGALACTIAE NOT ROUTINELY PERFORMED DUE TO PREDICTABILITY OF AMP/PEN/VAN SUSCEPTIBILITY. Performed at [facility], [address] [place], [place] [address]    Report Status 01/19/2022 FINAL  Final    Renal Function: No results for input(s): "CREATININE" in the last 168 hours. Estimated Creatinine Clearance: 99.1 mL/min  (A) (by C-G formula based on SCr of 1.4 mg/dL (H)).  Radiologic Imaging: No results found.  I independently reviewed the above imaging studies.  Assessment and Plan Daelen [facility]. is a 31 y.o. male with a proximal right ureteral stone measuring 38mm here for right ESWL.  The risks, benefits and alternatives of RIGHT ESWL was discussed with the patient. I described the risks which include arrhythmia, kidney contusion, kidney hemorrhage, need for transfusion, back discomfort, flank ecchymosis, flank abrasion, inability to fracture the stone, inability to pass stone fragments, [place]str[place], infection associated with obstructing stones, need for an alternative surgical procedure and possible need for repeat shockwave lithotripsy.  The patient voices understanding and wishes to proceed.      Matt R. Jaydi Bray MD 01/28/2022, 8:36 AM  [facility] Pager: [phone]): [phone]

## 2022-01-28 NOTE — Op Note (Signed)
[facility] Operative Note  Treating Physician: Rexene Alberts, MD  Pre-op diagnosis: Right proximal ureteral stone  Post-op diagnosis: Same   Procedure: Right ESWL  See Aris Everts OP note scanned into chart. Also because of the size, density, location and other factors that cannot be anticipated I feel this will likely be a staged procedure. This fact supersedes any indication in the scanned [place] stone operative note to the contrary.  Matt R. [facility]  Pager: [phone]

## 2022-01-29 ENCOUNTER — Encounter (HOSPITAL_BASED_OUTPATIENT_CLINIC_OR_DEPARTMENT_OTHER): Payer: Self-pay | Admitting: Urology

## 2022-02-01 ENCOUNTER — Ambulatory Visit: Payer: Self-pay

## 2022-02-01 ENCOUNTER — Ambulatory Visit
Admission: RE | Admit: 2022-02-01 | Discharge: 2022-02-01 | Disposition: A | Discharge status: Home / Self Care | Payer: BC Managed Care – PPO | Source: Ambulatory Visit | Attending: Family Medicine | Admitting: Family Medicine

## 2022-02-01 ENCOUNTER — Telehealth: Payer: Self-pay | Admitting: Emergency Medicine

## 2022-02-01 VITALS — BP 164/102 | HR 110 | Temp 98.8°F | Resp 18 | Ht 71.0 in | Wt 245.0 lb

## 2022-02-01 DIAGNOSIS — R300 Dysuria: Secondary | ICD-10-CM

## 2022-02-01 LAB — POCT URINALYSIS DIP (MANUAL ENTRY)
Bilirubin, UA: NEGATIVE
Glucose, UA: NEGATIVE mg/dL
Leukocytes, UA: NEGATIVE
Nitrite, UA: NEGATIVE
Protein Ur, POC: NEGATIVE mg/dL
Spec Grav, UA: 1.025 (ref 1.010–1.025)
Urobilinogen, UA: 0.2 E.U./dL
pH, UA: 6 (ref 5.0–8.0)

## 2022-02-01 MED ORDER — AMOXICILLIN 875 MG PO TABS: ORAL_TABLET | ORAL | 0 refills | Status: DC

## 2022-02-01 NOTE — ED Provider Notes (Signed)
[facility]    CSN: [id number] Arrival date & time: 02/01/22  1638      History   Chief Complaint Chief Complaint  Patient presents with   UTI    HPI Chris Proctor [facility]. is a 31 y.o. male.   Patient complains of mild urinary urgency and dysuria for two days.  He denies hematuria, fever, and nausea/vomiting.  Patient has a history of urolithiasis.  He was evaluated and treated at the [facility] emergency department on 01/18/22 for right flank and abdominal pain.  A CT renal scan showed a right ureteral calculus causing moderate right hydronephrosis.  There was also present a punctate non-obstructing right upper pole renal calculus.  Patient returned to the ED the next day with pain not managed with home medications.  He was scheduled for lithotripsy which was performed 4 days ago.  He tolerated the procedure well, and notes that he has been passing small stone fragments since then.   Dysuria Presenting symptoms: dysuria   Presenting symptoms: no penile discharge and no penile pain   Context: during intercourse   Relieved by:  None tried Worsened by:  Nothing Ineffective treatments:  None tried Associated symptoms: flank pain   Associated symptoms: no abdominal pain, no fever, no hematuria, no nausea, no scrotal swelling, no urinary frequency, no urinary hesitation, no urinary incontinence and no urinary retention   Risk factors: kidney stones     Past Medical History:  Diagnosis Date   ADHD (attention deficit hyperactivity disorder)    Anxiety    Asthma    Depression    Headache    hx of migraines    History of kidney stones    MRSA infection    hx of in 2006 and 2009    PONV (postoperative nausea and vomiting)    Ureterolithiasis     Patient Active Problem List   Diagnosis Date Noted   Reducible left inguinal hernia 12/04/2014    Past Surgical History:  Procedure Laterality Date   CYSTOSCOPY WITH STENT PLACEMENT Right 06/20/2019   Procedure:  CYSTOSCOPY WITH STENT PLACEMENT retrograde pylegram;  Surgeon: Rene Paci, MD;  Location: [facility];  Service: Urology;  Laterality: Right;   EXTRACORPOREAL SHOCK WAVE LITHOTRIPSY Right 06/24/2019   Procedure: EXTRACORPOREAL SHOCK WAVE LITHOTRIPSY (ESWL);  Surgeon: Malen Gauze, MD;  Location: [facility];  Service: Urology;  Laterality: Right;   EXTRACORPOREAL SHOCK WAVE LITHOTRIPSY Right 01/28/2022   Procedure: EXTRACORPOREAL SHOCK WAVE LITHOTRIPSY (ESWL);  Surgeon: Jannifer Hick, MD;  Location: [facility];  Service: Urology;  Laterality: Right;   INGUINAL HERNIA REPAIR Left 12/05/2014   Procedure: LEFT INGUINAL HERNIA REPAIR WITH  MESH;  Surgeon: Darnell Level, MD;  Location: [facility];  Service: General;  Laterality: Left;   INSERTION OF MESH Left 12/05/2014   Procedure: INSERTION OF MESH;  Surgeon: Darnell Level, MD;  Location: [facility];  Service: General;  Laterality: Left;   KNEE ARTHROSCOPY WITH LATERAL MENISECTOMY Right 07/29/2018   Procedure: RIGHT KNEE ARTHROSCOPY;  Surgeon: Bjorn Pippin, MD;  Location: [facility];  Service: Orthopedics;  Laterality: Right;   MOUTH SURGERY         Home Medications    Prior to Admission medications   Medication Sig Start Date End Date Taking? Authorizing Provider  albuterol (VENTOLIN HFA) 108 (90 Base) MCG/ACT inhaler Inhale 2 puffs into the lungs every 6 (six) hours as needed for wheezing or shortness of breath (  Cough). 01/02/22  Yes Olalere, Adewale A, MD  amoxicillin (AMOXIL) 875 MG tablet Take one tab PO Q12hr 02/01/22  Yes Lattie Haw, MD  buPROPion (WELLBUTRIN XL) 150 MG 24 hr tablet Take 150 mg by mouth daily.   Yes [provider]  Diclofenac Sodium CR 100 MG 24 hr tablet Take 1 tablet (100 mg total) by mouth daily. 01/20/22  Yes Palumbo, April, MD  fluticasone [facility]) 50 MCG/ACT nasal spray Place 1 spray into both nostrils daily. Begin by using 2 sprays in each nare daily  for 3 to 5 days, then decrease to 1 spray in each nare daily. Patient taking differently: Place 1 spray into both nostrils daily as needed for allergies. 12/14/21  Yes Theadora Rama Scales, PA-C  HYDROcodone-acetaminophen (NORCO/VICODIN) 5-325 MG tablet Take 1 tablet by mouth every 4 (four) hours as needed. Patient taking differently: Take 1 tablet by mouth every 4 (four) hours as needed for moderate pain or severe pain. 01/18/22  Yes Cristopher Peru, PA-C  HYDROcodone-acetaminophen (NORCO/VICODIN) 5-325 MG tablet Take 1 tablet by mouth every 6 (six) hours as needed. 01/20/22  Yes Palumbo, April, MD  ibuprofen (ADVIL) 400 MG tablet Take 1 tablet (400 mg total) by mouth every 8 (eight) hours as needed for up to 30 doses. 12/14/21  Yes Theadora Rama Scales, PA-C  Multiple Vitamins-Minerals (ALIVE MULTI-VITAMIN PO) Take 1 tablet by mouth daily.   Yes [provider]  ondansetron (ZOFRAN) 4 MG tablet Take 1 tablet (4 mg total) by mouth every 6 (six) hours. Patient taking differently: Take 4 mg by mouth every 6 (six) hours as needed for nausea or vomiting. 01/18/22  Yes Cristopher Peru, PA-C  sertraline (ZOLOFT) 100 MG tablet Take 100 mg by mouth daily.   Yes [provider]  tamsulosin (FLOMAX) 0.4 MG CAPS capsule Take 1 capsule (0.4 mg total) by mouth daily. 01/18/22  Yes Cristopher Peru, PA-C    Family History Family History  Problem Relation Age of Onset   Hyperlipidemia Mother     Social History Social History   Tobacco Use   Smoking status: Never   Smokeless tobacco: Never  Vaping Use   Vaping Use: Never used  Substance Use Topics   Alcohol use: Yes    Alcohol/week: 2.0 standard drinks of alcohol    Types: 2 Cans of beer per week    Comment: 1-2 x per week or beer on weekend   Drug use: No     Allergies   Patient has no known allergies.   Review of Systems Review of Systems  Constitutional:  Negative for activity change, appetite change, chills,  diaphoresis, fatigue and fever.  Gastrointestinal:  Negative for abdominal pain and nausea.  Genitourinary:  Positive for dysuria and flank pain. Negative for bladder incontinence, difficulty urinating, frequency, hematuria, hesitancy, penile discharge, penile pain, scrotal swelling, testicular pain and urgency.  All other systems reviewed and are negative.    Physical Exam Triage Vital Signs ED Triage Vitals  Enc Vitals Group     BP 02/01/22 1708 (!) 164/102     Pulse Rate 02/01/22 1708 (!) 110     Resp 02/01/22 1708 18     Temp 02/01/22 1708 98.8 F (37.1 C)     Temp Source 02/01/22 1708 Oral     SpO2 02/01/22 1708 97 %     Weight 02/01/22 1710 245 lb (111.1 kg)     Height 02/01/22 1710 5\' 11"  (1.803 m)  Head Circumference --      Peak Flow --      Pain Score 02/01/22 1709 0     Pain Loc --      Pain Edu? --      Excl. in GC? --    No data found.  Updated Vital Signs BP (!) 164/102 (BP Location: Left Arm)   Pulse (!) 110   Temp 98.8 F (37.1 C) (Oral)   Resp 18   Ht 5\' 11"  (1.803 m)   Wt 111.1 kg   SpO2 97%   BMI 34.17 kg/m   Visual Acuity Right Eye Distance:   Left Eye Distance:   Bilateral Distance:    Right Eye Near:   Left Eye Near:    Bilateral Near:     Physical Exam Nursing notes and Vital Signs reviewed. Appearance:  Patient appears stated age, and in no acute distress.    Eyes:  Pupils are equal, round, and reactive to light and accomodation.  Extraocular movement is intact.  Conjunctivae are not inflamed   Pharynx:  Normal; moist mucous membranes  Neck:  Supple.  No adenopathy Lungs:  Clear to auscultation.  Breath sounds are equal.  Moving air well. Heart:  Regular rate and rhythm without murmurs, rubs, or gallops. Rate 110. Abdomen:  Minimal tenderness over bladder without masses or hepatosplenomegaly.  Bowel sounds are present.  Mild right flank tenderness is present. Extremities:  No edema.  Skin:  No rash present.     UC Treatments /  Results  Labs (all labs ordered are listed, but only abnormal results are displayed) Labs Reviewed  POCT URINALYSIS DIP (MANUAL ENTRY) - Abnormal; Notable for the following components:      Result Value   Color, UA other (*)    Ketones, POC UA trace (5) (*)    Blood, UA moderate (*)    All other components within normal limits  URINE CULTURE    EKG   Radiology No results found.  Procedures Procedures (including critical care time)  Medications Ordered in UC Medications - No data to display  Initial Impression / Assessment and Plan / UC Course  I have reviewed the triage vital signs and the nursing notes.  Pertinent labs & imaging results that were available during my care of the patient were reviewed by me and considered in my medical decision making (see chart for details).    ?UTI.  Urine culture pending.  Review of chart lab reports reveals a urine culture done 01/18/22 that grew group B strep (S. Agalactiae).  Therefore will begin empiric amoxicillin. Followup with urologist if not improved one week.  Final Clinical Impressions(s) / UC Diagnoses   Final diagnoses:  Dysuria     Discharge Instructions      Increase fluid intake. May use non-prescription AZO for about two to three days, if desired, to decrease urinary discomfort.   If symptoms become significantly worse during the night or over the weekend, proceed to the local emergency room.     ED Prescriptions     Medication Sig Dispense Auth. Provider   amoxicillin (AMOXIL) 875 MG tablet Take one tab PO Q12hr 14 tablet 01/20/22, MD         Lattie Haw, MD 02/03/22 1150

## 2022-02-01 NOTE — ED Triage Notes (Signed)
Patient c/o possible UTI for a couple of days.  Patient c/o urgency and discomfort.  Patient did have lithotripsy on Monday am.  Did not have a stent in place.  Patient has been passing stone fragments since procedure.  Denies any hematuria today.  Afebrile.

## 2022-02-01 NOTE — Discharge Instructions (Signed)
Increase fluid intake. May use non-prescription AZO for about two to three days, if desired, to decrease urinary discomfort.  If symptoms become significantly worse during the night or over the weekend, proceed to the local emergency room.  

## 2022-02-01 NOTE — Telephone Encounter (Signed)
Call to [facility] to see if he could come in earlier - per pt he thinks he may have a UTI- pt had lithotripsy on Monday - no stent placement per pt-Chris Proctor attempted to call his urologist, was unable to complete call due to wait time and patient is currently at work. Pt is unable to come in earlier today.

## 2022-02-02 ENCOUNTER — Ambulatory Visit: Payer: BC Managed Care – PPO

## 2022-02-02 ENCOUNTER — Telehealth: Payer: Self-pay | Admitting: Emergency Medicine

## 2022-02-02 NOTE — Telephone Encounter (Signed)
Spoke with patient states that he is doing much better today.  Patient will continue medication and follow up as needed.

## 2022-02-03 LAB — URINE CULTURE: Culture: NO GROWTH

## 2022-02-07 DIAGNOSIS — M545 Low back pain, unspecified: Secondary | ICD-10-CM | POA: Diagnosis not present

## 2022-02-20 DIAGNOSIS — N201 Calculus of ureter: Secondary | ICD-10-CM | POA: Diagnosis not present

## 2022-03-14 DIAGNOSIS — M545 Low back pain, unspecified: Secondary | ICD-10-CM | POA: Diagnosis not present

## 2022-03-21 DIAGNOSIS — M545 Low back pain, unspecified: Secondary | ICD-10-CM | POA: Diagnosis not present

## 2022-05-16 DIAGNOSIS — G8929 Other chronic pain: Secondary | ICD-10-CM | POA: Diagnosis not present

## 2022-05-16 DIAGNOSIS — Z0000 Encounter for general adult medical examination without abnormal findings: Secondary | ICD-10-CM | POA: Diagnosis not present

## 2022-05-16 DIAGNOSIS — J4520 Mild intermittent asthma, uncomplicated: Secondary | ICD-10-CM | POA: Diagnosis not present

## 2022-05-16 DIAGNOSIS — R748 Abnormal levels of other serum enzymes: Secondary | ICD-10-CM | POA: Diagnosis not present

## 2022-05-16 DIAGNOSIS — M545 Low back pain, unspecified: Secondary | ICD-10-CM | POA: Diagnosis not present

## 2022-06-26 ENCOUNTER — Other Ambulatory Visit (HOSPITAL_COMMUNITY): Payer: Self-pay | Admitting: Internal Medicine

## 2022-06-26 DIAGNOSIS — F339 Major depressive disorder, recurrent, unspecified: Secondary | ICD-10-CM | POA: Diagnosis not present

## 2022-06-26 DIAGNOSIS — E1011 Type 1 diabetes mellitus with ketoacidosis with coma: Secondary | ICD-10-CM | POA: Diagnosis not present

## 2022-06-26 DIAGNOSIS — R1011 Right upper quadrant pain: Secondary | ICD-10-CM | POA: Diagnosis not present

## 2022-06-26 DIAGNOSIS — F411 Generalized anxiety disorder: Secondary | ICD-10-CM | POA: Diagnosis not present

## 2022-06-27 ENCOUNTER — Ambulatory Visit (HOSPITAL_COMMUNITY)
Admission: RE | Admit: 2022-06-27 | Discharge: 2022-06-27 | Disposition: A | Discharge status: Home / Self Care | Payer: BC Managed Care – PPO | Source: Ambulatory Visit | Attending: Internal Medicine | Admitting: Internal Medicine

## 2022-06-27 DIAGNOSIS — R1011 Right upper quadrant pain: Secondary | ICD-10-CM | POA: Diagnosis not present

## 2022-06-27 DIAGNOSIS — K760 Fatty (change of) liver, not elsewhere classified: Secondary | ICD-10-CM | POA: Diagnosis not present

## 2022-07-04 DIAGNOSIS — R1011 Right upper quadrant pain: Secondary | ICD-10-CM | POA: Diagnosis not present

## 2022-07-04 DIAGNOSIS — F411 Generalized anxiety disorder: Secondary | ICD-10-CM | POA: Diagnosis not present

## 2022-07-04 DIAGNOSIS — F339 Major depressive disorder, recurrent, unspecified: Secondary | ICD-10-CM | POA: Diagnosis not present

## 2022-08-06 DIAGNOSIS — F411 Generalized anxiety disorder: Secondary | ICD-10-CM | POA: Diagnosis not present

## 2022-08-06 DIAGNOSIS — F331 Major depressive disorder, recurrent, moderate: Secondary | ICD-10-CM | POA: Diagnosis not present

## 2022-08-08 DIAGNOSIS — F419 Anxiety disorder, unspecified: Secondary | ICD-10-CM | POA: Diagnosis not present

## 2022-08-16 DIAGNOSIS — K760 Fatty (change of) liver, not elsewhere classified: Secondary | ICD-10-CM | POA: Diagnosis not present

## 2022-08-16 DIAGNOSIS — R109 Unspecified abdominal pain: Secondary | ICD-10-CM | POA: Diagnosis not present

## 2022-08-19 ENCOUNTER — Other Ambulatory Visit (HOSPITAL_COMMUNITY): Payer: Self-pay | Admitting: Gastroenterology

## 2022-08-19 DIAGNOSIS — R1011 Right upper quadrant pain: Secondary | ICD-10-CM

## 2022-08-26 DIAGNOSIS — F331 Major depressive disorder, recurrent, moderate: Secondary | ICD-10-CM | POA: Diagnosis not present

## 2022-08-26 DIAGNOSIS — F411 Generalized anxiety disorder: Secondary | ICD-10-CM | POA: Diagnosis not present

## 2022-08-30 ENCOUNTER — Ambulatory Visit (HOSPITAL_COMMUNITY)
Admission: RE | Admit: 2022-08-30 | Discharge: 2022-08-30 | Disposition: A | Discharge status: Home / Self Care | Payer: BC Managed Care – PPO | Source: Ambulatory Visit | Attending: Gastroenterology | Admitting: Gastroenterology

## 2022-08-30 DIAGNOSIS — R1011 Right upper quadrant pain: Secondary | ICD-10-CM | POA: Insufficient documentation

## 2022-08-30 DIAGNOSIS — R109 Unspecified abdominal pain: Secondary | ICD-10-CM | POA: Diagnosis not present

## 2022-08-30 MED ORDER — TECHNETIUM TC 99M MEBROFENIN IV KIT
5.5000 | PACK | Freq: Once | INTRAVENOUS | Status: AC | PRN
  Administered 2022-08-30: 5.5 via INTRAVENOUS

## 2022-10-11 DIAGNOSIS — R1011 Right upper quadrant pain: Secondary | ICD-10-CM | POA: Diagnosis not present

## 2022-10-18 DIAGNOSIS — F331 Major depressive disorder, recurrent, moderate: Secondary | ICD-10-CM | POA: Diagnosis not present

## 2022-10-18 DIAGNOSIS — F411 Generalized anxiety disorder: Secondary | ICD-10-CM | POA: Diagnosis not present

## 2022-11-06 DIAGNOSIS — F331 Major depressive disorder, recurrent, moderate: Secondary | ICD-10-CM | POA: Diagnosis not present

## 2022-11-06 DIAGNOSIS — F411 Generalized anxiety disorder: Secondary | ICD-10-CM | POA: Diagnosis not present

## 2022-11-20 DIAGNOSIS — F331 Major depressive disorder, recurrent, moderate: Secondary | ICD-10-CM | POA: Diagnosis not present

## 2022-11-20 DIAGNOSIS — F411 Generalized anxiety disorder: Secondary | ICD-10-CM | POA: Diagnosis not present

## 2022-11-27 DIAGNOSIS — F331 Major depressive disorder, recurrent, moderate: Secondary | ICD-10-CM | POA: Diagnosis not present

## 2022-11-27 DIAGNOSIS — F411 Generalized anxiety disorder: Secondary | ICD-10-CM | POA: Diagnosis not present

## 2022-12-04 DIAGNOSIS — F411 Generalized anxiety disorder: Secondary | ICD-10-CM | POA: Diagnosis not present

## 2022-12-04 DIAGNOSIS — F331 Major depressive disorder, recurrent, moderate: Secondary | ICD-10-CM | POA: Diagnosis not present

## 2022-12-11 ENCOUNTER — Ambulatory Visit: Payer: BC Managed Care – PPO | Admitting: Pulmonary Disease

## 2022-12-11 DIAGNOSIS — F331 Major depressive disorder, recurrent, moderate: Secondary | ICD-10-CM | POA: Diagnosis not present

## 2022-12-11 DIAGNOSIS — F411 Generalized anxiety disorder: Secondary | ICD-10-CM | POA: Diagnosis not present

## 2022-12-13 DIAGNOSIS — F331 Major depressive disorder, recurrent, moderate: Secondary | ICD-10-CM | POA: Diagnosis not present

## 2022-12-13 DIAGNOSIS — F411 Generalized anxiety disorder: Secondary | ICD-10-CM | POA: Diagnosis not present

## 2023-01-10 DIAGNOSIS — F331 Major depressive disorder, recurrent, moderate: Secondary | ICD-10-CM | POA: Diagnosis not present

## 2023-01-10 DIAGNOSIS — F411 Generalized anxiety disorder: Secondary | ICD-10-CM | POA: Diagnosis not present

## 2023-01-31 DIAGNOSIS — F331 Major depressive disorder, recurrent, moderate: Secondary | ICD-10-CM | POA: Diagnosis not present

## 2023-01-31 DIAGNOSIS — F411 Generalized anxiety disorder: Secondary | ICD-10-CM | POA: Diagnosis not present

## 2023-02-10 DIAGNOSIS — F411 Generalized anxiety disorder: Secondary | ICD-10-CM | POA: Diagnosis not present

## 2023-02-10 DIAGNOSIS — F331 Major depressive disorder, recurrent, moderate: Secondary | ICD-10-CM | POA: Diagnosis not present

## 2023-02-14 ENCOUNTER — Other Ambulatory Visit: Payer: Self-pay

## 2023-02-14 ENCOUNTER — Ambulatory Visit
Admission: RE | Admit: 2023-02-14 | Discharge: 2023-02-14 | Disposition: A | Discharge status: Home / Self Care | Payer: BC Managed Care – PPO | Source: Ambulatory Visit | Attending: Family Medicine | Admitting: Family Medicine

## 2023-02-14 VITALS — BP 166/98 | HR 91 | Temp 97.9°F | Resp 16

## 2023-02-14 DIAGNOSIS — J029 Acute pharyngitis, unspecified: Secondary | ICD-10-CM | POA: Diagnosis not present

## 2023-02-14 LAB — POCT RAPID STREP A (OFFICE): Rapid Strep A Screen: NEGATIVE

## 2023-02-14 NOTE — ED Provider Notes (Signed)
[facility]    CSN: [id number] Arrival date & time: 02/14/23  1230      History   Chief Complaint Chief Complaint  Patient presents with   Sore Throat    Entered by patient   Cough    HPI Chris Motts [facility]. is a 32 y.o. male.    Sore Throat  Cough Associated symptoms: sore throat    Patient is here for sore throat and cough since yesterday.  No fevers/chills.  No runny nose, congestion.  No known sick contacts.  No otc meds used.        Past Medical History:  Diagnosis Date   ADHD (attention deficit hyperactivity disorder)    Anxiety    Asthma    Depression    Headache    hx of migraines    History of kidney stones    MRSA infection    hx of in 2006 and 2009    PONV (postoperative nausea and vomiting)    Ureterolithiasis     Patient Active Problem List   Diagnosis Date Noted   Reducible left inguinal hernia 12/04/2014    Past Surgical History:  Procedure Laterality Date   CYSTOSCOPY WITH STENT PLACEMENT Right 06/20/2019   Procedure: CYSTOSCOPY WITH STENT PLACEMENT retrograde pylegram;  Surgeon: Rene Paci, MD;  Location: [facility];  Service: Urology;  Laterality: Right;   EXTRACORPOREAL SHOCK WAVE LITHOTRIPSY Right 06/24/2019   Procedure: EXTRACORPOREAL SHOCK WAVE LITHOTRIPSY (ESWL);  Surgeon: Malen Gauze, MD;  Location: [facility];  Service: Urology;  Laterality: Right;   EXTRACORPOREAL SHOCK WAVE LITHOTRIPSY Right 01/28/2022   Procedure: EXTRACORPOREAL SHOCK WAVE LITHOTRIPSY (ESWL);  Surgeon: Jannifer Hick, MD;  Location: [facility];  Service: Urology;  Laterality: Right;   INGUINAL HERNIA REPAIR Left 12/05/2014   Procedure: LEFT INGUINAL HERNIA REPAIR WITH  MESH;  Surgeon: Darnell Level, MD;  Location: [facility];  Service: General;  Laterality: Left;   INSERTION OF MESH Left 12/05/2014   Procedure: INSERTION OF MESH;  Surgeon: Darnell Level, MD;  Location: [facility];  Service: General;   Laterality: Left;   KNEE ARTHROSCOPY WITH LATERAL MENISECTOMY Right 07/29/2018   Procedure: RIGHT KNEE ARTHROSCOPY;  Surgeon: Bjorn Pippin, MD;  Location: [facility];  Service: Orthopedics;  Laterality: Right;   MOUTH SURGERY         Home Medications    Prior to Admission medications   Medication Sig Start Date End Date Taking? Authorizing Provider  azithromycin (ZITHROMAX) 250 MG tablet Take by mouth. Patient not taking: Reported on 02/14/2023 11/21/22   [provider]  busPIRone (BUSPAR) 5 MG tablet Take 5 mg by mouth 2 (two) times daily. 02/10/23  Yes [provider]  fluticasone-salmeterol (WIXELA INHUB) 100-50 MCG/ACT AEPB 1 puff Inhalation Twice a day for 30 days 05/16/22   [provider]  hydrOXYzine (ATARAX) 10 MG tablet Take by mouth. 02/10/23  Yes [provider]  Methylphenidate HCl ER 54 MG TB24 Take by mouth. Patient not taking: Reported on 02/14/2023 02/08/16   [provider]  olopatadine (PATANOL) 0.1 % ophthalmic solution 1 drop into affected eye Ophthalmic PRN 07/26/11   [provider]  albuterol (VENTOLIN HFA) 108 (90 Base) MCG/ACT inhaler Inhale 2 puffs into the lungs every 6 (six) hours as needed for wheezing or shortness of breath (Cough). 01/02/22   Tomma Lightning, MD  amoxicillin (AMOXIL) 875 MG tablet Take one tab PO Q12hr Patient not  taking: Reported on 02/14/2023 02/01/22   Lattie Haw, MD  buPROPion (WELLBUTRIN XL) 150 MG 24 hr tablet Take 150 mg by mouth daily.   Yes [provider]  Diclofenac Sodium CR 100 MG 24 hr tablet Take 1 tablet (100 mg total) by mouth daily. Patient not taking: Reported on 02/14/2023 01/20/22   Palumbo, April, MD  fluticasone [facility]) 50 MCG/ACT nasal spray Place 1 spray into both nostrils daily. Begin by using 2 sprays in each nare daily for 3 to 5 days, then decrease to 1 spray in each nare daily. Patient taking differently: Place 1 spray into both  nostrils daily as needed for allergies. 12/14/21  Yes Theadora Rama Scales, PA-C  HYDROcodone-acetaminophen (NORCO/VICODIN) 5-325 MG tablet Take 1 tablet by mouth every 4 (four) hours as needed. Patient not taking: Reported on 02/14/2023 01/18/22   Cristopher Peru, PA-C  HYDROcodone-acetaminophen (NORCO/VICODIN) 5-325 MG tablet Take 1 tablet by mouth every 6 (six) hours as needed. Patient not taking: Reported on 02/14/2023 01/20/22   Palumbo, April, MD  ibuprofen (ADVIL) 400 MG tablet Take 1 tablet (400 mg total) by mouth every 8 (eight) hours as needed for up to 30 doses. 12/14/21  Yes Theadora Rama Scales, PA-C  Multiple Vitamins-Minerals (ALIVE MULTI-VITAMIN PO) Take 1 tablet by mouth daily. Patient not taking: Reported on 02/14/2023    [provider]  ondansetron (ZOFRAN) 4 MG tablet Take 1 tablet (4 mg total) by mouth every 6 (six) hours. Patient taking differently: Take 4 mg by mouth every 6 (six) hours as needed for nausea or vomiting. 01/18/22   Cristopher Peru, PA-C  sertraline (ZOLOFT) 100 MG tablet Take 100 mg by mouth daily.   Yes [provider]  tamsulosin (FLOMAX) 0.4 MG CAPS capsule Take 1 capsule (0.4 mg total) by mouth daily. 01/18/22   Cristopher Peru, PA-C    Family History Family History  Problem Relation Age of Onset   Hyperlipidemia Mother     Social History Social History   Tobacco Use   Smoking status: Never   Smokeless tobacco: Never  Vaping Use   Vaping status: Never Used  Substance Use Topics   Alcohol use: Yes    Alcohol/week: 2.0 standard drinks of alcohol    Types: 2 Cans of beer per week    Comment: 1-2 x per week or beer on weekend   Drug use: No     Allergies   Hydrocodone   Review of Systems Review of Systems  Constitutional: Negative.   HENT:  Positive for sore throat.   Respiratory:  Positive for cough.   Gastrointestinal: Negative.   Genitourinary: Negative.   Musculoskeletal: Negative.    Psychiatric/Behavioral: Negative.       Physical Exam Triage Vital Signs ED Triage Vitals  Encounter Vitals Group     BP 02/14/23 1246 (!) 166/98     Systolic BP Percentile --      Diastolic BP Percentile --      Pulse Rate 02/14/23 1246 91     Resp 02/14/23 1246 16     Temp 02/14/23 1246 97.9 F (36.6 C)     Temp Source 02/14/23 1246 Oral     SpO2 02/14/23 1246 98 %     Weight --      Height --      Head Circumference --      Peak Flow --      Pain Score 02/14/23 1248 3     Pain Loc --  Pain Education --      Exclude from Growth Chart --    No data found.  Updated Vital Signs BP (!) 166/98 (BP Location: Left Arm)   Pulse 91   Temp 97.9 F (36.6 C) (Oral)   Resp 16   SpO2 98%   Visual Acuity Right Eye Distance:   Left Eye Distance:   Bilateral Distance:    Right Eye Near:   Left Eye Near:    Bilateral Near:     Physical Exam Constitutional:      General: He is not in acute distress.    Appearance: He is well-developed and normal weight. He is not ill-appearing.  HENT:     Nose: No congestion or rhinorrhea.     Mouth/Throat:     Mouth: Mucous membranes are moist.     Pharynx: Posterior oropharyngeal erythema present. No pharyngeal swelling or oropharyngeal exudate.     Tonsils: No tonsillar exudate.  Cardiovascular:     Rate and Rhythm: Normal rate and regular rhythm.     Heart sounds: Normal heart sounds.  Pulmonary:     Effort: Pulmonary effort is normal.     Breath sounds: Normal breath sounds.  Musculoskeletal:     Cervical back: Normal range of motion and neck supple.  Lymphadenopathy:     Cervical: No cervical adenopathy.  Skin:    General: Skin is warm.  Neurological:     General: No focal deficit present.     Mental Status: He is alert.  Psychiatric:        Mood and Affect: Mood normal.      UC Treatments / Results  Labs (all labs ordered are listed, but only abnormal results are displayed) Labs Reviewed  POCT RAPID STREP A  (OFFICE) - Normal  CULTURE, GROUP A STREP [facility])    EKG   Radiology No results found.  Procedures Procedures (including critical care time)  Medications Ordered in UC Medications - No data to display  Initial Impression / Assessment and Plan / UC Course  I have reviewed the triage vital signs and the nursing notes.  Pertinent labs & imaging results that were available during my care of the patient were reviewed by me and considered in my medical decision making (see chart for details).  Final Clinical Impressions(s) / UC Diagnoses   Final diagnoses:  Acute pharyngitis, unspecified etiology     Discharge Instructions      You were seen today for sore throat.  Your rapid strep was negative, and will be sent for culture.  If positive you will be notified to treat.  Otherwise, this appears viral.  I recommend tylenol, motrin and warm salt water gargles for pain.      ED Prescriptions   None    PDMP not reviewed this encounter.   Jannifer Franklin, MD 02/14/23 1325

## 2023-02-14 NOTE — ED Triage Notes (Signed)
Pt reports sore throat and nonproductive cough x1 day. Denies fevers, congestion, chills, and body aches.

## 2023-02-14 NOTE — Discharge Instructions (Signed)
You were seen today for sore throat.  Your rapid strep was negative, and will be sent for culture.  If positive you will be notified to treat.  Otherwise, this appears viral.  I recommend tylenol, motrin and warm salt water gargles for pain.

## 2023-02-16 DIAGNOSIS — J069 Acute upper respiratory infection, unspecified: Secondary | ICD-10-CM | POA: Diagnosis not present

## 2023-02-16 DIAGNOSIS — R051 Acute cough: Secondary | ICD-10-CM | POA: Diagnosis not present

## 2023-02-17 DIAGNOSIS — F411 Generalized anxiety disorder: Secondary | ICD-10-CM | POA: Diagnosis not present

## 2023-02-17 DIAGNOSIS — F331 Major depressive disorder, recurrent, moderate: Secondary | ICD-10-CM | POA: Diagnosis not present

## 2023-02-17 LAB — CULTURE, GROUP A STREP (THRC)

## 2023-03-04 DIAGNOSIS — F411 Generalized anxiety disorder: Secondary | ICD-10-CM | POA: Diagnosis not present

## 2023-03-04 DIAGNOSIS — F331 Major depressive disorder, recurrent, moderate: Secondary | ICD-10-CM | POA: Diagnosis not present

## 2023-03-10 DIAGNOSIS — M47816 Spondylosis without myelopathy or radiculopathy, lumbar region: Secondary | ICD-10-CM | POA: Diagnosis not present

## 2023-03-10 DIAGNOSIS — M9903 Segmental and somatic dysfunction of lumbar region: Secondary | ICD-10-CM | POA: Diagnosis not present

## 2023-03-11 DIAGNOSIS — M9903 Segmental and somatic dysfunction of lumbar region: Secondary | ICD-10-CM | POA: Diagnosis not present

## 2023-03-11 DIAGNOSIS — M47816 Spondylosis without myelopathy or radiculopathy, lumbar region: Secondary | ICD-10-CM | POA: Diagnosis not present

## 2023-03-13 DIAGNOSIS — M9903 Segmental and somatic dysfunction of lumbar region: Secondary | ICD-10-CM | POA: Diagnosis not present

## 2023-03-13 DIAGNOSIS — M47816 Spondylosis without myelopathy or radiculopathy, lumbar region: Secondary | ICD-10-CM | POA: Diagnosis not present

## 2023-03-18 DIAGNOSIS — M9903 Segmental and somatic dysfunction of lumbar region: Secondary | ICD-10-CM | POA: Diagnosis not present

## 2023-03-18 DIAGNOSIS — M47816 Spondylosis without myelopathy or radiculopathy, lumbar region: Secondary | ICD-10-CM | POA: Diagnosis not present

## 2023-03-28 DIAGNOSIS — F411 Generalized anxiety disorder: Secondary | ICD-10-CM | POA: Diagnosis not present

## 2023-03-28 DIAGNOSIS — F331 Major depressive disorder, recurrent, moderate: Secondary | ICD-10-CM | POA: Diagnosis not present

## 2023-04-09 DIAGNOSIS — F331 Major depressive disorder, recurrent, moderate: Secondary | ICD-10-CM | POA: Diagnosis not present

## 2023-04-09 DIAGNOSIS — F411 Generalized anxiety disorder: Secondary | ICD-10-CM | POA: Diagnosis not present

## 2023-04-10 DIAGNOSIS — F331 Major depressive disorder, recurrent, moderate: Secondary | ICD-10-CM | POA: Diagnosis not present

## 2023-04-10 DIAGNOSIS — F411 Generalized anxiety disorder: Secondary | ICD-10-CM | POA: Diagnosis not present

## 2023-04-19 DIAGNOSIS — H6993 Unspecified Eustachian tube disorder, bilateral: Secondary | ICD-10-CM | POA: Diagnosis not present

## 2023-04-19 DIAGNOSIS — R0981 Nasal congestion: Secondary | ICD-10-CM | POA: Diagnosis not present

## 2023-04-19 DIAGNOSIS — R519 Headache, unspecified: Secondary | ICD-10-CM | POA: Diagnosis not present

## 2023-05-13 DIAGNOSIS — F411 Generalized anxiety disorder: Secondary | ICD-10-CM | POA: Diagnosis not present

## 2023-05-13 DIAGNOSIS — F331 Major depressive disorder, recurrent, moderate: Secondary | ICD-10-CM | POA: Diagnosis not present

## 2023-05-14 DIAGNOSIS — Z0000 Encounter for general adult medical examination without abnormal findings: Secondary | ICD-10-CM | POA: Diagnosis not present

## 2023-05-19 DIAGNOSIS — R748 Abnormal levels of other serum enzymes: Secondary | ICD-10-CM | POA: Diagnosis not present

## 2023-05-19 DIAGNOSIS — F419 Anxiety disorder, unspecified: Secondary | ICD-10-CM | POA: Diagnosis not present

## 2023-05-19 DIAGNOSIS — J4520 Mild intermittent asthma, uncomplicated: Secondary | ICD-10-CM | POA: Diagnosis not present

## 2023-05-19 DIAGNOSIS — I10 Essential (primary) hypertension: Secondary | ICD-10-CM | POA: Diagnosis not present

## 2023-05-19 DIAGNOSIS — Z0000 Encounter for general adult medical examination without abnormal findings: Secondary | ICD-10-CM | POA: Diagnosis not present

## 2023-05-19 DIAGNOSIS — H6693 Otitis media, unspecified, bilateral: Secondary | ICD-10-CM | POA: Diagnosis not present

## 2023-05-26 DIAGNOSIS — Z87442 Personal history of urinary calculi: Secondary | ICD-10-CM | POA: Diagnosis not present

## 2023-06-10 DIAGNOSIS — F411 Generalized anxiety disorder: Secondary | ICD-10-CM | POA: Diagnosis not present

## 2023-06-10 DIAGNOSIS — F331 Major depressive disorder, recurrent, moderate: Secondary | ICD-10-CM | POA: Diagnosis not present

## 2023-06-16 DIAGNOSIS — L600 Ingrowing nail: Secondary | ICD-10-CM | POA: Diagnosis not present

## 2023-06-16 DIAGNOSIS — K219 Gastro-esophageal reflux disease without esophagitis: Secondary | ICD-10-CM | POA: Diagnosis not present

## 2023-06-16 DIAGNOSIS — N200 Calculus of kidney: Secondary | ICD-10-CM | POA: Diagnosis not present

## 2023-06-16 DIAGNOSIS — I10 Essential (primary) hypertension: Secondary | ICD-10-CM | POA: Diagnosis not present

## 2023-06-16 DIAGNOSIS — Z23 Encounter for immunization: Secondary | ICD-10-CM | POA: Diagnosis not present

## 2023-07-02 DIAGNOSIS — M25561 Pain in right knee: Secondary | ICD-10-CM | POA: Diagnosis not present

## 2023-07-03 DIAGNOSIS — F411 Generalized anxiety disorder: Secondary | ICD-10-CM | POA: Diagnosis not present

## 2023-07-03 DIAGNOSIS — F331 Major depressive disorder, recurrent, moderate: Secondary | ICD-10-CM | POA: Diagnosis not present

## 2023-07-08 DIAGNOSIS — F411 Generalized anxiety disorder: Secondary | ICD-10-CM | POA: Diagnosis not present

## 2023-07-08 DIAGNOSIS — F331 Major depressive disorder, recurrent, moderate: Secondary | ICD-10-CM | POA: Diagnosis not present

## 2023-07-14 DIAGNOSIS — Z23 Encounter for immunization: Secondary | ICD-10-CM | POA: Diagnosis not present

## 2023-07-14 DIAGNOSIS — I10 Essential (primary) hypertension: Secondary | ICD-10-CM | POA: Diagnosis not present

## 2023-08-05 DIAGNOSIS — F331 Major depressive disorder, recurrent, moderate: Secondary | ICD-10-CM | POA: Diagnosis not present

## 2023-08-05 DIAGNOSIS — F411 Generalized anxiety disorder: Secondary | ICD-10-CM | POA: Diagnosis not present

## 2023-09-02 DIAGNOSIS — F331 Major depressive disorder, recurrent, moderate: Secondary | ICD-10-CM | POA: Diagnosis not present

## 2023-09-02 DIAGNOSIS — F411 Generalized anxiety disorder: Secondary | ICD-10-CM | POA: Diagnosis not present

## 2023-09-30 DIAGNOSIS — F411 Generalized anxiety disorder: Secondary | ICD-10-CM | POA: Diagnosis not present

## 2023-09-30 DIAGNOSIS — F331 Major depressive disorder, recurrent, moderate: Secondary | ICD-10-CM | POA: Diagnosis not present

## 2023-10-03 DIAGNOSIS — F411 Generalized anxiety disorder: Secondary | ICD-10-CM | POA: Diagnosis not present

## 2023-10-03 DIAGNOSIS — F331 Major depressive disorder, recurrent, moderate: Secondary | ICD-10-CM | POA: Diagnosis not present

## 2023-10-06 DIAGNOSIS — R1084 Generalized abdominal pain: Secondary | ICD-10-CM | POA: Diagnosis not present

## 2023-12-11 DIAGNOSIS — F331 Major depressive disorder, recurrent, moderate: Secondary | ICD-10-CM | POA: Diagnosis not present

## 2023-12-11 DIAGNOSIS — F411 Generalized anxiety disorder: Secondary | ICD-10-CM | POA: Diagnosis not present

## 2023-12-29 DIAGNOSIS — F411 Generalized anxiety disorder: Secondary | ICD-10-CM | POA: Diagnosis not present

## 2023-12-29 DIAGNOSIS — F331 Major depressive disorder, recurrent, moderate: Secondary | ICD-10-CM | POA: Diagnosis not present

## 2024-01-27 DIAGNOSIS — F411 Generalized anxiety disorder: Secondary | ICD-10-CM | POA: Diagnosis not present

## 2024-01-27 DIAGNOSIS — F331 Major depressive disorder, recurrent, moderate: Secondary | ICD-10-CM | POA: Diagnosis not present
